# Patient Record
Sex: Male | Born: 1950 | Race: White | Hispanic: No | Marital: Married | Smoking: Never smoker
Health system: Southern US, Community
[De-identification: ages and names within clinical notes are randomized; demographics above are authoritative.]

## PROBLEM LIST (undated history)

## (undated) DIAGNOSIS — M546 Pain in thoracic spine: Secondary | ICD-10-CM

## (undated) DIAGNOSIS — I2699 Other pulmonary embolism without acute cor pulmonale: Secondary | ICD-10-CM

## (undated) DIAGNOSIS — N1330 Unspecified hydronephrosis: Secondary | ICD-10-CM

## (undated) DIAGNOSIS — R413 Other amnesia: Secondary | ICD-10-CM

## (undated) DIAGNOSIS — N281 Cyst of kidney, acquired: Secondary | ICD-10-CM

## (undated) DIAGNOSIS — N400 Enlarged prostate without lower urinary tract symptoms: Secondary | ICD-10-CM

## (undated) DIAGNOSIS — B351 Tinea unguium: Secondary | ICD-10-CM

## (undated) DIAGNOSIS — K219 Gastro-esophageal reflux disease without esophagitis: Secondary | ICD-10-CM

## (undated) DIAGNOSIS — N200 Calculus of kidney: Secondary | ICD-10-CM

## (undated) DIAGNOSIS — R197 Diarrhea, unspecified: Secondary | ICD-10-CM

## (undated) DIAGNOSIS — R5383 Other fatigue: Secondary | ICD-10-CM

## (undated) DIAGNOSIS — E785 Hyperlipidemia, unspecified: Secondary | ICD-10-CM

## (undated) DIAGNOSIS — J189 Pneumonia, unspecified organism: Secondary | ICD-10-CM

## (undated) DIAGNOSIS — R109 Unspecified abdominal pain: Secondary | ICD-10-CM

## (undated) DIAGNOSIS — M722 Plantar fascial fibromatosis: Secondary | ICD-10-CM

## (undated) DIAGNOSIS — N179 Acute kidney failure, unspecified: Secondary | ICD-10-CM

## (undated) DIAGNOSIS — C439 Malignant melanoma of skin, unspecified: Secondary | ICD-10-CM

## (undated) DIAGNOSIS — K279 Peptic ulcer, site unspecified, unspecified as acute or chronic, without hemorrhage or perforation: Secondary | ICD-10-CM

## (undated) HISTORY — DX: Diarrhea, unspecified: R19.7

## (undated) HISTORY — DX: Calculus of kidney: N20.0

## (undated) HISTORY — DX: Pneumonia, unspecified organism: J18.9

## (undated) HISTORY — DX: Plantar fascial fibromatosis: M72.2

## (undated) HISTORY — DX: Cyst of kidney, acquired: N28.1

## (undated) HISTORY — DX: Malignant melanoma of skin, unspecified: C43.9

## (undated) HISTORY — PX: TONSILLECTOMY: SHX5217

## (undated) HISTORY — DX: Pain in thoracic spine: M54.6

## (undated) HISTORY — DX: Other pulmonary embolism without acute cor pulmonale: I26.99

## (undated) HISTORY — DX: Peptic ulcer, site unspecified, unspecified as acute or chronic, without hemorrhage or perforation: K27.9

## (undated) HISTORY — DX: Unspecified abdominal pain: R10.9

## (undated) HISTORY — DX: Unspecified hydronephrosis: N13.30

## (undated) HISTORY — DX: Benign prostatic hyperplasia without lower urinary tract symptoms: N40.0

## (undated) HISTORY — DX: Other amnesia: R41.3

## (undated) HISTORY — DX: Other fatigue: R53.83

## (undated) HISTORY — PX: OTHER SURGICAL HISTORY: SHX169

## (undated) HISTORY — DX: Tinea unguium: B35.1

## (undated) HISTORY — DX: Hyperlipidemia, unspecified: E78.5

## (undated) HISTORY — DX: Gastro-esophageal reflux disease without esophagitis: K21.9

## (undated) HISTORY — DX: Acute kidney failure, unspecified: N17.9

---

## 2005-03-15 ENCOUNTER — Emergency Department: Payer: Self-pay | Admitting: General Practice

## 2005-03-16 ENCOUNTER — Other Ambulatory Visit: Payer: Self-pay

## 2005-03-16 ENCOUNTER — Emergency Department: Payer: Self-pay | Admitting: Emergency Medicine

## 2005-09-24 ENCOUNTER — Inpatient Hospital Stay: Payer: Self-pay | Admitting: Internal Medicine

## 2005-10-09 ENCOUNTER — Ambulatory Visit: Payer: Self-pay | Admitting: Internal Medicine

## 2005-10-23 ENCOUNTER — Ambulatory Visit: Payer: Self-pay | Admitting: Internal Medicine

## 2006-09-18 IMAGING — PT NM PET TUM IMG LTD AREA
1 series · 25 of 25 positions shown · non-contrast
Comparison: none

REASON FOR EXAM: ABN CT showed masses, eval for lung CA
COMMENTS:

[Series 4: pet wb · axial · 2.0mm · 4.06mm/px · z∈[-872,-14]mm · 25 of 344 slices shown]
[im 1/344]
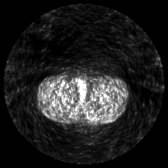
[im 15/344]
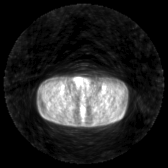
[im 29/344]
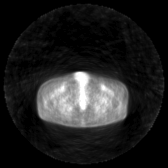
[im 43/344]
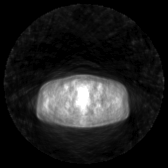
[im 58/344]
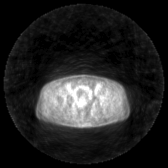
[im 72/344]
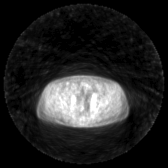
[im 86/344]
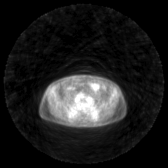
[im 101/344]
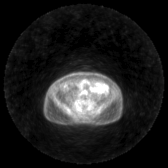
[im 115/344]
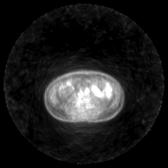
[im 129/344]
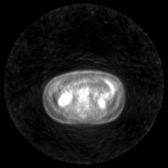
[im 143/344]
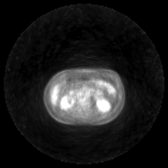
[im 158/344]
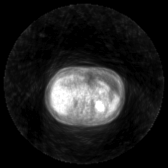
[im 172/344]
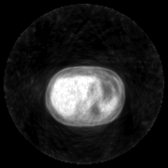
[im 186/344]
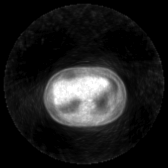
[im 201/344]
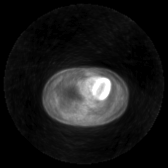
[im 215/344]
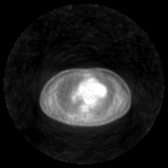
[im 229/344]
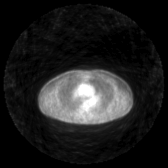
[im 243/344]
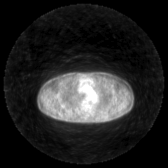
[im 258/344]
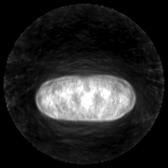
[im 272/344]
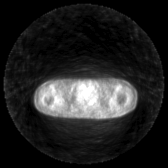
[im 286/344]
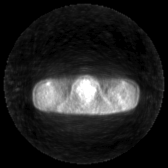
[im 301/344]
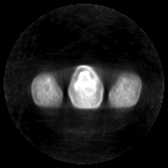
[im 315/344]
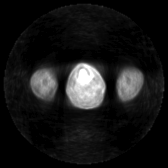
[im 329/344]
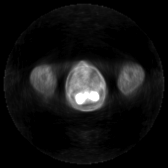
[im 344/344]
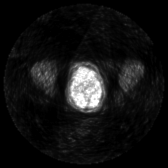

[25 of 25 positions shown; findings below may reference images not displayed]

PROCEDURE:     PET - PET/CT DX LUNG CA  - October 23, 2005 [DATE]

RESULT:          The patient had a questionable abnormality on a recent CT
scan. The patient's fasting blood glucose level today was 95 mg/dl.  The
patient received 15.5 mCi of fluorine-25 labeled fluorodeoxyglucose at [DATE]
hours, with the scanning beginning at [DATE] hours.

Uptake of the radiopharmaceutical over the thorax is normal.  Specific
attention to the areas of abnormal appearance on the CT images reveals no
abnormal uptake here.  No mediastinal or hilar lymphadenopathy is suspected.
 Activity over the neck is within the limits of normal as well.

Within the abdomen and pelvis, normally expected uptake within bowel and
kidneys and urinary bladder is seen.  I see no abnormal activity elsewhere.
IMPRESSION: I do not see findings on this study to suggest
malignancy.

## 2012-09-30 ENCOUNTER — Ambulatory Visit: Payer: Self-pay | Admitting: Unknown Physician Specialty

## 2012-12-17 ENCOUNTER — Ambulatory Visit: Payer: Self-pay | Admitting: Unknown Physician Specialty

## 2012-12-17 LAB — CREATININE, SERUM
Creatinine: 1.26 mg/dL (ref 0.60–1.30)
EGFR (African American): >60
EGFR (Non-African Amer.): >60

## 2013-05-31 ENCOUNTER — Ambulatory Visit: Payer: Self-pay | Admitting: Internal Medicine

## 2014-10-06 ENCOUNTER — Emergency Department
Admit: 2014-10-06 | Disposition: A | Discharge status: Home / Self Care | Payer: Self-pay | Admitting: Physician Assistant

## 2014-10-06 LAB — URINALYSIS, COMPLETE
Bacteria: NONE SEEN
Bilirubin,UR: NEGATIVE
Glucose,UR: NEGATIVE mg/dL (ref 0–75)
Ketone: NEGATIVE
LEUKOCYTE ESTERASE: NEGATIVE
NITRITE: NEGATIVE
Ph: 5 (ref 4.5–8.0)
Protein: 30
RBC,UR: 109 /HPF (ref 0–5)
Specific Gravity: 1.02 (ref 1.003–1.030)
Squamous Epithelial: <1
WBC UR: 2 /HPF (ref 0–5)

## 2014-10-06 LAB — COMPREHENSIVE METABOLIC PANEL
ALBUMIN: 4.5 g/dL
Alkaline Phosphatase: 69 U/L
Anion Gap: 6 — ABNORMAL LOW (ref 7–16)
BILIRUBIN TOTAL: 0.8 mg/dL
BUN: 22 mg/dL — ABNORMAL HIGH
Calcium, Total: 9.5 mg/dL
Chloride: 108 mmol/L
Co2: 25 mmol/L
Creatinine: 1.42 mg/dL — ABNORMAL HIGH
EGFR (Non-African Amer.): 51 — ABNORMAL LOW
GFR CALC AF AMER: 60 — AB
Glucose: 157 mg/dL — ABNORMAL HIGH
POTASSIUM: 4.5 mmol/L
SGOT(AST): 31 U/L
SGPT (ALT): 24 U/L
Sodium: 139 mmol/L
TOTAL PROTEIN: 7.5 g/dL

## 2014-10-06 LAB — CBC
HCT: 42.3 % (ref 40.0–52.0)
HGB: 14.2 g/dL (ref 13.0–18.0)
MCH: 30.3 pg (ref 26.0–34.0)
MCHC: 33.6 g/dL (ref 32.0–36.0)
MCV: 90 fL (ref 80–100)
Platelet: 218 10*3/uL (ref 150–440)
RBC: 4.69 10*6/uL (ref 4.40–5.90)
RDW: 13.5 % (ref 11.5–14.5)
WBC: 12.4 10*3/uL — ABNORMAL HIGH (ref 3.8–10.6)

## 2014-10-25 ENCOUNTER — Ambulatory Visit
Admit: 2014-10-25 | Disposition: A | Discharge status: Home / Self Care | Payer: Self-pay | Attending: Urology | Admitting: Urology

## 2014-10-25 LAB — BASIC METABOLIC PANEL
Anion Gap: 10 (ref 7–16)
BUN: 26 mg/dL — ABNORMAL HIGH
CHLORIDE: 102 mmol/L
Calcium, Total: 9.2 mg/dL
Co2: 25 mmol/L
Creatinine: 2.16 mg/dL — ABNORMAL HIGH
EGFR (African American): 36 — ABNORMAL LOW
GFR CALC NON AF AMER: 31 — AB
GLUCOSE: 105 mg/dL — AB
Potassium: 4.2 mmol/L
Sodium: 137 mmol/L

## 2014-10-25 LAB — CBC
HCT: 40.4 % (ref 40.0–52.0)
HGB: 13.6 g/dL (ref 13.0–18.0)
MCH: 30.2 pg (ref 26.0–34.0)
MCHC: 33.5 g/dL (ref 32.0–36.0)
MCV: 90 fL (ref 80–100)
Platelet: 199 10*3/uL (ref 150–440)
RBC: 4.5 10*6/uL (ref 4.40–5.90)
RDW: 13 % (ref 11.5–14.5)
WBC: 13.3 10*3/uL — ABNORMAL HIGH (ref 3.8–10.6)

## 2014-10-27 LAB — URINE CULTURE

## 2014-10-31 ENCOUNTER — Ambulatory Visit
Admit: 2014-10-31 | Disposition: A | Discharge status: Home / Self Care | Payer: Self-pay | Attending: Urology | Admitting: Urology

## 2014-11-04 NOTE — Op Note (Signed)
PATIENT NAME:  Carl Williams, Carl Williams MR#:  [id number] DATE OF BIRTH:  December 25, 1950  DATE OF PROCEDURE:  10/25/2014  PREOPERATIVE DIAGNOSIS:  Right flank pain, history of bilateral stones.   POSTOPERATIVE DIAGNOSIS:  Right flank pain, history of bilateral stones, right hydroureteronephrosis, pyonephrosis.   ANESTHESIA: General anesthesia.    ATTENDING SURGEON: Sherlynn Stalls, MD.   FINDINGS: Right hydroureteronephrosis down to the level of the distal ureter with possible filling defect consistent with stone, purulent, debris-filled drainage following stent placement.  SPECIMEN: Urine culture.   DRAINS: A 6 x 26 French double-J ureteral stent placement on the right (no string).   COMPLICATIONS: None.   INDICATION: This is a 64 year old male with a history of bilateral kidney stones who has developed worsening right flank pain over the past week. On recent CT scan he did have a left distal ureter stone, but felt that he would pass this without further flank pain. Given the severity of pain he was offered ureteral stent placement. Incidentally in the preoperative holding area he was found to be mildly tachycardic and had a low-grade temperature to 100.8. Risks and benefits of the procedure were explained in detail. The patient agreed to proceed as planned.   PROCEDURE: The patient was correctly identified in the preoperative holding area and informed consent was confirmed. He was brought to the operating suite and placed on the table in supine position. At this time universal timeout protocol was performed. All team members were identified. Venodyne boots were placed and he was administered IV Ancef in the perioperative period. He was then placed under general anesthesia, repositioned lower on the bed in the dorsal lithotomy position, and prepped and draped in standard surgical fashion. At this point in time a 22 French rigid cystoscope was advanced per urethra into the bladder. Attention was turned  then to the left ureteral orifice, which was cannulated using a 5 [place] open-ended ureteral catheter just within the UO. Retrograde pyelogram was performed on this side, which revealed a delicate-appearing ureter without any filling defects or evidence of left hydroureteronephrosis. The calyces and infundibulum were delicate without any obvious filling defects. Attention was then turned to the right ureteral orifice, which was cannulated with a 5 [place] open-ended ureteral catheter in a similar manner. Quite differently on this side, there was mild hydroureteronephrosis extending all the way down to the level of the distal ureter where there was a clear transition point and possible filling defect here. The very distal ureter was completely decompressed. Given the concern for stone on this side I did elect to go ahead and place a stent, in the setting of also possible infection.  A Sensor wire was placed all the way up to the level of the kidney without difficulty. At this point in time a 6 x 26 French double-J ureteral stent was then advanced over the wire to the level of the renal pelvis, confirmed fluoroscopically. The wire was partially withdrawn and a coil was noted within the renal pelvis. The wire was then fully withdrawn and a coil was noted within the bladder. Upon stent placement there was a good amount of purulent and debris-filled, almost feculent-appearing drainage from the right kidney ureteral stent. Urine from the bladder after stent placement was sent off for urine culture. The bladder was then drained. The patient was repositioned in supine position, reversed from anesthesia, and taken to the PACU in stable condition. There were no complications in this case.    ____________________________ Sherlynn Stalls, MD  ajb:bu D: 10/25/2014 11:17:45 ET T: 10/25/2014 14:23:03 ET JOB#: [id number]  cc: Sherlynn Stalls, MD, <Dictator> Sherlynn Stalls MD ELECTRONICALLY SIGNED 11/01/2014 16:24

## 2014-11-22 ENCOUNTER — Other Ambulatory Visit: Payer: Self-pay | Admitting: Urology

## 2014-11-22 DIAGNOSIS — N200 Calculus of kidney: Secondary | ICD-10-CM

## 2014-12-12 ENCOUNTER — Ambulatory Visit
Admission: RE | Admit: 2014-12-12 | Discharge: 2014-12-12 | Disposition: A | Discharge status: Home / Self Care | Payer: BC Managed Care – PPO | Source: Ambulatory Visit | Attending: Urology | Admitting: Urology

## 2014-12-12 DIAGNOSIS — N1330 Unspecified hydronephrosis: Secondary | ICD-10-CM | POA: Diagnosis not present

## 2014-12-12 DIAGNOSIS — N200 Calculus of kidney: Secondary | ICD-10-CM | POA: Diagnosis present

## 2014-12-17 ENCOUNTER — Ambulatory Visit: Payer: Self-pay | Admitting: Urology

## 2014-12-27 ENCOUNTER — Ambulatory Visit (INDEPENDENT_AMBULATORY_CARE_PROVIDER_SITE_OTHER): Payer: BC Managed Care – PPO | Admitting: Urology

## 2014-12-27 ENCOUNTER — Encounter: Payer: Self-pay | Admitting: Urology

## 2014-12-27 VITALS — BP 118/71 | HR 60 | Ht 68.0 in | Wt 170.9 lb

## 2014-12-27 DIAGNOSIS — N132 Hydronephrosis with renal and ureteral calculous obstruction: Secondary | ICD-10-CM | POA: Diagnosis not present

## 2014-12-27 DIAGNOSIS — N401 Enlarged prostate with lower urinary tract symptoms: Secondary | ICD-10-CM | POA: Diagnosis not present

## 2014-12-27 DIAGNOSIS — N138 Other obstructive and reflux uropathy: Secondary | ICD-10-CM

## 2014-12-27 DIAGNOSIS — N200 Calculus of kidney: Secondary | ICD-10-CM | POA: Diagnosis not present

## 2014-12-27 LAB — URINALYSIS, COMPLETE
Bilirubin, UA: NEGATIVE
Glucose, UA: NEGATIVE
KETONES UA: NEGATIVE
LEUKOCYTES UA: NEGATIVE
Nitrite, UA: NEGATIVE
Protein, UA: NEGATIVE
RBC, UA: NEGATIVE
Specific Gravity, UA: 1.02 (ref 1.005–1.030)
Urobilinogen, Ur: 0.2 mg/dL (ref 0.2–1.0)
pH, UA: 6.5 (ref 5.0–7.5)

## 2014-12-27 LAB — MICROSCOPIC EXAMINATION: BACTERIA UA: NONE SEEN

## 2014-12-27 NOTE — Progress Notes (Signed)
12/27/2014 8:37 AM   Carl Williams 1951-03-25 [id number]  Referring provider: No referring provider defined for this encounter.  Chief Complaint  Patient presents with  . Follow-up    Diagnostice procedure, Renal US results    HPI: Carl Williams is a 64 year old white male who presents today for his RUS results.  He is feeling well today.  He specifically denies fevers, chills, nausea, vomiting, hematuria or flank pain.  His RUS performed on 12/12/2014 demonstrated mild right sided hydronephrosis.    Background Hx:  64 year old male with history of bilateral nephrolithiasis who initially presented found to have a LEFT 5 x 3 mm UVJ stone on CT scan who presented to our office for follow-up on 10/24/14. At that time, his left-sided flank pain had completely resolved but he developed acute onset severe right-sided flank pain.   Given the severity of the pain, he was taken to the operating room following morning for bilateral retrograde pyelogram as well as RIGHT ureteral stent placement. Retrograde pyelogram at that time revealed a decompressed LEFT ureter. The RIGHT ureter had a filling defect within the distal ureter which was small and was hydronephrotic all the redundant that level. At the time of stent placement, significant purulent drainage was noted and incidentally he was noted to be mildly febrile in the preoperative holding area prior to this procedure. He was discharged home on antibiotics. His intraoperative urine culture was ultimately negative. He was noted to have acute renal failure with a creatinine of 2.16 at the time of stent placement.  At follow up visit, a KUB showed no evidence of residual stones in the RIGHT distal ureter and his stent was removed.     PMH: Past Medical History  Diagnosis Date  . Memory difficulties   . Onychomycosis   . GERD (gastroesophageal reflux disease)   . Back pain, thoracic   . Diarrhea   . HLD (hyperlipidemia)   . PE  (pulmonary embolism)   . Peptic ulcer   . Melanoma   . Fatigue   . Plantar fascial fibromatosis   . Pneumonia   . Hydronephrosis   . Kidney stone on left side   . Acute renal failure   . Right flank pain   . BPH (benign prostatic hyperplasia)   . Renal cyst     Surgical History: Past Surgical History  Procedure Laterality Date  . Tonsillectomy      Home Medications:    Medication List       This list is accurate as of: 12/27/14  8:37 AM.  Always use your most recent med list.               tamsulosin 0.4 MG Caps capsule  Commonly known as:  FLOMAX  Take 0.4 mg by mouth.        Allergies:  Allergies  Allergen Reactions  . Ciprofloxacin Rash    Family History: No family history on file.  Social History:  reports that he has never smoked. He does not have any smokeless tobacco history on file. He reports that he does not drink alcohol. His drug history is not on file.  ROS: Urological Symptom Review  Patient is experiencing the following symptoms: Hard to postpone urination   Review of Systems  Gastrointestinal (upper)  : Negative for upper GI symptoms  Gastrointestinal (lower) : Negative for lower GI symptoms  Constitutional : Negative for symptoms  Skin: Negative for skin symptoms  Eyes: Negative for eye symptoms  Ear/Nose/Throat : Negative for Ear/Nose/Throat symptoms  Hematologic/Lymphatic: Negative for Hematologic/Lymphatic symptoms  Cardiovascular : Negative for cardiovascular symptoms  Respiratory : Negative for respiratory symptoms  Endocrine: Negative for endocrine symptoms  Musculoskeletal: Negative for musculoskeletal symptoms  Neurological: Negative for neurological symptoms  Psychologic: Negative for psychiatric symptoms   Physical Exam: BP 118/71 mmHg  Pulse 60  Ht 5\' 8"  (1.727 m)  Wt 170 lb 14.4 oz (77.52 kg)  BMI 25.99 kg/m2   Laboratory Data: Results for orders placed or performed in visit on  12/27/14  Microscopic Examination  Result Value Ref Range   WBC, UA 0-5 0 -  5 /hpf   RBC, UA 0-2 0 -  2 /hpf   Epithelial Cells (non renal) 0-10 0 - 10 /hpf   Bacteria, UA None seen None seen/Few  Urinalysis, Complete  Result Value Ref Range   Specific Gravity, UA 1.020 1.005 - 1.030   pH, UA 6.5 5.0 - 7.5   Color, UA Yellow Yellow   Appearance Ur Clear Clear   Leukocytes, UA Negative Negative   Protein, UA Negative Negative/Trace   Glucose, UA Negative Negative   Ketones, UA Negative Negative   RBC, UA Negative Negative   Bilirubin, UA Negative Negative   Urobilinogen, Ur 0.2 0.2 - 1.0 mg/dL   Nitrite, UA Negative Negative   Microscopic Examination See below:    Lab Results  Component Value Date   WBC 13.3* 10/25/2014   HGB 13.6 10/25/2014   HCT 40.4 10/25/2014   MCV 90 10/25/2014   PLT 199 10/25/2014    Lab Results  Component Value Date   CREATININE 2.16* 10/25/2014    No results found for: PSA  No results found for: TESTOSTERONE  No results found for: HGBA1C  Urinalysis No results found for: COLORURINE, APPEARANCEUR, LABSPEC, PHURINE, GLUCOSEU, HGBUR, BILIRUBINUR, KETONESUR, PROTEINUR, UROBILINOGEN, NITRITE, LEUKOCYTESUR  Pertinent Imaging: CLINICAL DATA: Calculus of the kidney.  EXAM: RENAL / URINARY TRACT ULTRASOUND COMPLETE  COMPARISON: One-view abdomen 10/31/2014. CT of the abdomen pelvis 10/06/2014.  FINDINGS: Right Kidney:  Length: 12.0 cm, within normal limits. Echogenicity within normal limits. Mild hydronephrosis is present. No echogenic stones are identified. No obstructing lesion is evident.  Left Kidney:  Length: 11.2 cm, within normal limits. Echogenicity within normal limits. No mass or echogenic stone is evident. There is no significant hydronephrosis.  Bladder:  Appears normal for degree of bladder distention.  IMPRESSION: 1. Mild right-sided hydronephrosis without identification of the obstructing lesion. 2. No  echogenic stones are identified.   Electronically Signed  By: San Morelle M.D.  On: 12/12/2014 14:34   Assessment & Plan:    1. Mild right-sided hydronephrosis:  Background Hx:  64 year old male with history of bilateral nephrolithiasis who initially presented found to have a LEFT 5 x 3 mm UVJ stone on CT scan who presented to our office for follow-up on 10/24/14. At that time, his left-sided flank pain had completely resolved but he developed acute onset severe right-sided flank pain.   Given the severity of the pain, he was taken to the operating room following morning for bilateral retrograde pyelogram as well as RIGHT ureteral stent placement. Retrograde pyelogram at that time revealed a decompressed LEFT ureter. The RIGHT ureter had a filling defect within the distal ureter which was small and was hydronephrotic all the redundant that level. At the time of stent placement, significant purulent drainage was noted and incidentally he was noted to be mildly febrile in the preoperative holding area prior to  this procedure. He was discharged home on antibiotics. His intraoperative urine culture was ultimately negative. He was noted to have acute renal failure with a creatinine of 2.16 at the time of stent placement.  At follow up visit, a KUB showed no evidence of residual stones in the RIGHT distal ureter and his stent was removed.    RUS completed on 12/12/2014 noted a mild right-sided hydronephrosis.  Patient is not experiencing flank pain, nausea or vomiting at this time.  This may be a chronic finding.  We will obtain a renal panel and repeat the RUS in one month.    2. Nephrolithiasis:   Patient now has a history of stones.  We will obtain a 24 urine and he will return to the office to discuss the results.    3. BPH with LUTS:   Patient was found to have an enlarged prostate on exam at his initial encounter.  We will draw a PSA today.  He will complete an IPSS  score sheet when he returns to discuss his 24 hour urine results.   There are no diagnoses linked to this encounter.  No Follow-up on file.  Zara Council, [facility] [address] [place], [place] [address] [phone]

## 2014-12-28 ENCOUNTER — Telehealth: Payer: Self-pay

## 2014-12-28 DIAGNOSIS — N200 Calculus of kidney: Secondary | ICD-10-CM

## 2014-12-28 LAB — RENAL FUNCTION PANEL
ALBUMIN: 4 g/dL (ref 3.6–4.8)
BUN / CREAT RATIO: 12 (ref 10–22)
BUN: 14 mg/dL (ref 8–27)
CALCIUM: 9.7 mg/dL (ref 8.6–10.2)
CO2: 24 mmol/L (ref 18–29)
Chloride: 99 mmol/L (ref 97–108)
Creatinine, Ser: 1.15 mg/dL (ref 0.76–1.27)
GFR calc Af Amer: 77 mL/min/{1.73_m2} (ref >59)
GFR calc non Af Amer: 66 mL/min/{1.73_m2} (ref >59)
Glucose: 91 mg/dL (ref 65–99)
Phosphorus: 2.2 mg/dL — ABNORMAL LOW (ref 2.5–4.5)
Potassium: 4.1 mmol/L (ref 3.5–5.2)
Sodium: 138 mmol/L (ref 134–144)

## 2014-12-28 LAB — PSA: Prostate Specific Ag, Serum: 2.8 ng/mL (ref 0.0–4.0)

## 2014-12-28 NOTE — Telephone Encounter (Signed)
-----   Message from Nori Riis, PA-C sent at 12/28/2014  8:33 AM EDT ----- Labs are good.  We will see him in one month for his 24 hour report and a repeat RUS to see if the mild swelling in the kidney resolves.

## 2014-12-28 NOTE — Telephone Encounter (Signed)
Spoke with pt in reference to labs. Made pt aware he will need to complete 24hr urine and RUS prior to appt. Pt voiced understanding. RUS order has been placed. Cw,lpn

## 2015-01-08 ENCOUNTER — Other Ambulatory Visit: Payer: BC Managed Care – PPO

## 2015-01-24 ENCOUNTER — Telehealth: Payer: Self-pay

## 2015-01-24 ENCOUNTER — Other Ambulatory Visit: Payer: Self-pay | Admitting: Urology

## 2015-01-24 NOTE — Telephone Encounter (Signed)
LMOM

## 2015-01-24 NOTE — Telephone Encounter (Signed)
Are you looking for PSA and/or litholink?

## 2015-01-24 NOTE — Telephone Encounter (Signed)
-----   Message from Nori Riis, PA-C sent at 01/24/2015 11:08 AM EDT ----- Patient will need an office visit to discuss his Litho link results.

## 2015-01-24 NOTE — Telephone Encounter (Signed)
-----   Message from Nori Riis, PA-C sent at 01/24/2015 10:45 AM EDT ----- I received a message stating lab reports have been scanned, but I cannot find lab reports in the patient's chart.

## 2015-01-25 NOTE — Telephone Encounter (Signed)
-----   Message from Nori Riis, PA-C sent at 01/24/2015 11:08 AM EDT ----- Patient will need an office visit to discuss his Litho link results.

## 2015-01-25 NOTE — Telephone Encounter (Signed)
Spoke with pt in reference for the need of an appt for results. Pt voiced understanding. [place] office will call pt back with f/u appt.

## 2015-01-28 ENCOUNTER — Ambulatory Visit: Payer: BC Managed Care – PPO

## 2015-01-29 ENCOUNTER — Ambulatory Visit: Payer: BC Managed Care – PPO | Admitting: Urology

## 2015-02-06 ENCOUNTER — Other Ambulatory Visit: Payer: Self-pay | Admitting: Internal Medicine

## 2015-02-06 DIAGNOSIS — R413 Other amnesia: Secondary | ICD-10-CM

## 2015-02-06 DIAGNOSIS — R42 Dizziness and giddiness: Secondary | ICD-10-CM

## 2015-02-12 ENCOUNTER — Ambulatory Visit: Payer: BC Managed Care – PPO

## 2015-02-22 ENCOUNTER — Ambulatory Visit: Payer: BC Managed Care – PPO

## 2015-02-27 ENCOUNTER — Telehealth: Payer: Self-pay | Admitting: Urology

## 2015-02-27 NOTE — Telephone Encounter (Signed)
Patient was

## 2015-02-27 NOTE — Telephone Encounter (Signed)
Patient was to have a follow up RUS because he still had some residual hydronephrosis on his last RUS.  Would you call the patient to reschedule his RUS?

## 2015-03-07 NOTE — Telephone Encounter (Signed)
Spoke with patient and he said he forgot about the [place] appt and then we got disconnected. I tried calling him back but could never get him so i left a message and told him to let me know if he wanted to reschd and i would take care of it for him.  Thanks, Sharyn Lull

## 2015-03-19 NOTE — Telephone Encounter (Signed)
We will need to send a certified letter to him about scheduling his RUS.

## 2015-03-22 NOTE — Telephone Encounter (Signed)
Letter sent.

## 2015-06-17 ENCOUNTER — Telehealth: Payer: Self-pay | Admitting: Urology

## 2015-06-17 ENCOUNTER — Other Ambulatory Visit: Payer: Self-pay | Admitting: Urology

## 2015-06-17 DIAGNOSIS — N132 Hydronephrosis with renal and ureteral calculous obstruction: Secondary | ICD-10-CM

## 2015-06-17 NOTE — Telephone Encounter (Signed)
I called [facility] to order the [place] that the patient no showed for back in July. They said that you would need to put in a new order since it was 42 months old. Once i get the new order i will release it and they will call him and get it set up. The patient is aware.  Thanks,  Sharyn Lull

## 2015-06-21 ENCOUNTER — Ambulatory Visit
Admission: RE | Admit: 2015-06-21 | Discharge: 2015-06-21 | Disposition: A | Discharge status: Home / Self Care | Payer: BC Managed Care – PPO | Source: Ambulatory Visit | Attending: Urology | Admitting: Urology

## 2015-06-21 DIAGNOSIS — N1330 Unspecified hydronephrosis: Secondary | ICD-10-CM | POA: Diagnosis not present

## 2015-06-21 DIAGNOSIS — N400 Enlarged prostate without lower urinary tract symptoms: Secondary | ICD-10-CM | POA: Insufficient documentation

## 2015-06-21 DIAGNOSIS — N132 Hydronephrosis with renal and ureteral calculous obstruction: Secondary | ICD-10-CM

## 2015-06-28 ENCOUNTER — Encounter: Payer: Self-pay | Admitting: Urology

## 2015-06-28 ENCOUNTER — Ambulatory Visit (INDEPENDENT_AMBULATORY_CARE_PROVIDER_SITE_OTHER): Payer: BC Managed Care – PPO | Admitting: Urology

## 2015-06-28 VITALS — BP 128/85 | HR 60 | Ht 68.0 in | Wt 171.8 lb

## 2015-06-28 DIAGNOSIS — N138 Other obstructive and reflux uropathy: Secondary | ICD-10-CM

## 2015-06-28 DIAGNOSIS — N401 Enlarged prostate with lower urinary tract symptoms: Secondary | ICD-10-CM | POA: Diagnosis not present

## 2015-06-28 DIAGNOSIS — N2889 Other specified disorders of kidney and ureter: Secondary | ICD-10-CM

## 2015-06-28 DIAGNOSIS — N200 Calculus of kidney: Secondary | ICD-10-CM | POA: Diagnosis not present

## 2015-06-28 LAB — URINALYSIS, COMPLETE
BILIRUBIN UA: NEGATIVE
Glucose, UA: NEGATIVE
KETONES UA: NEGATIVE
LEUKOCYTES UA: NEGATIVE
Nitrite, UA: NEGATIVE
Protein, UA: NEGATIVE
Specific Gravity, UA: 1.01 (ref 1.005–1.030)
Urobilinogen, Ur: 0.2 mg/dL (ref 0.2–1.0)
pH, UA: 6.5 (ref 5.0–7.5)

## 2015-06-28 LAB — MICROSCOPIC EXAMINATION
Bacteria, UA: NONE SEEN
Epithelial Cells (non renal): NONE SEEN /hpf (ref 0–10)

## 2015-06-28 NOTE — Progress Notes (Signed)
10:04 PM   TARIF MCMANAWAY 1950/10/12 [id number]  Referring provider: Idelle Crouch, MD [facility] [place], [place] [address]  Chief Complaint  Patient presents with  . Results    [place]    HPI: [name] is a 64 year old white male who presents today for his RUS results.  He is feeling well today.  He specifically denies fevers, chills, nausea, vomiting, hematuria or flank pain.  His RUS performed on 06/21/2015 demonstrated mild right pelvicaliectasis.  His UA today is unremarkable.  Background Hx:  64 year old male with history of bilateral nephrolithiasis who initially presented found to have a LEFT 5 x 3 mm UVJ stone on CT scan who presented to our office for follow-up on 10/24/14. At that time, his left-sided flank pain had completely resolved but he developed acute onset severe right-sided flank pain.   Given the severity of the pain, he was taken to the operating room following morning for bilateral retrograde pyelogram as well as RIGHT ureteral stent placement. Retrograde pyelogram at that time revealed a decompressed LEFT ureter. The RIGHT ureter had a filling defect within the distal ureter which was small and was hydronephrotic all the redundant that level. At the time of stent placement, significant purulent drainage was noted and incidentally he was noted to be mildly febrile in the preoperative holding area prior to this procedure. He was discharged home on antibiotics. His intraoperative urine culture was ultimately negative. He was noted to have acute renal failure with a creatinine of 2.16 at the time of stent placement.  At follow up visit, a KUB showed no evidence of residual stones in the RIGHT distal ureter and his stent was removed.    He underwent a follow-up renal ultrasound on 12/12/2014 and it noted mild right-sided hydronephrosis.  He was to undergo a follow-up renal ultrasound in 1 month, but he was lost to  follow-up.  I have reviewed the images with the patient and Dr. Pilar Jarvis and agree with the findings.     PMH: Past Medical History  Diagnosis Date  . Memory difficulties   . Onychomycosis   . GERD (gastroesophageal reflux disease)   . Back pain, thoracic   . Diarrhea   . HLD (hyperlipidemia)   . PE (pulmonary embolism)   . Peptic ulcer   . Melanoma ([place])   . Fatigue   . Plantar fascial fibromatosis   . Pneumonia   . Hydronephrosis   . Kidney stone on left side   . Acute renal failure ([place])   . Right flank pain   . BPH (benign prostatic hyperplasia)   . Renal cyst     Surgical History: Past Surgical History  Procedure Laterality Date  . Tonsillectomy      Home Medications:    Medication List       This list is accurate as of: 06/28/15 10:04 PM.  Always use your most recent med list.               amphetamine-dextroamphetamine 10 MG tablet  Commonly known as:  ADDERALL  Take by mouth.     tamsulosin 0.4 MG Caps capsule  Commonly known as:  FLOMAX  Take 0.4 mg by mouth. Reported on 06/28/2015        Allergies:  Allergies  Allergen Reactions  . Ciprofloxacin Rash    Family History: Family History  Problem Relation Age of Onset  . Kidney disease Neg Hx   . Prostate  cancer Neg Hx     Social History:  reports that he has never smoked. He does not have any smokeless tobacco history on file. He reports that he does not drink alcohol or use illicit drugs.  ROS: Urological Symptom Review  Patient is experiencing the following symptoms: Hard to postpone urination   Review of Systems  Gastrointestinal (upper)  : Negative for upper GI symptoms  Gastrointestinal (lower) : Negative for lower GI symptoms  Constitutional : Negative for symptoms  Skin: Negative for skin symptoms  Eyes: Negative for eye symptoms  Ear/Nose/Throat : Negative for Ear/Nose/Throat symptoms  Hematologic/Lymphatic: Negative for Hematologic/Lymphatic  symptoms  Cardiovascular : Negative for cardiovascular symptoms  Respiratory : Negative for respiratory symptoms  Endocrine: Negative for endocrine symptoms  Musculoskeletal: Negative for musculoskeletal symptoms  Neurological: Negative for neurological symptoms  Psychologic: Negative for psychiatric symptoms   Physical Exam: BP 128/85 mmHg  Pulse 60  Ht 5\' 8"  (1.727 m)  Wt 171 lb 12.8 oz (77.928 kg)  BMI 26.13 kg/m2  Constitutional: Well nourished. Alert and oriented, No acute distress. HEENT: [place] AT, moist mucus membranes. Trachea midline, no masses. Cardiovascular: No clubbing, cyanosis, or edema. Respiratory: Normal respiratory effort, no increased work of breathing. Skin: No rashes, bruises or suspicious lesions. Lymph: No cervical or inguinal adenopathy. Neurologic: Grossly intact, no focal deficits, moving all 4 extremities. Psychiatric: Normal mood and affect.  Laboratory Data: Results for orders placed or performed in visit on 06/28/15  Microscopic Examination  Result Value Ref Range   WBC, UA 0-5 0 -  5 /hpf   RBC, UA 0-2 0 -  2 /hpf   Epithelial Cells (non renal) None seen 0 - 10 /hpf   Bacteria, UA None seen None seen/Few  Urinalysis, Complete  Result Value Ref Range   Specific Gravity, UA 1.010 1.005 - 1.030   pH, UA 6.5 5.0 - 7.5   Color, UA Yellow Yellow   Appearance Ur Clear Clear   Leukocytes, UA Negative Negative   Protein, UA Negative Negative/Trace   Glucose, UA Negative Negative   Ketones, UA Negative Negative   RBC, UA 1+ (A) Negative   Bilirubin, UA Negative Negative   Urobilinogen, Ur 0.2 0.2 - 1.0 mg/dL   Nitrite, UA Negative Negative   Microscopic Examination See below:    Lab Results  Component Value Date   WBC 13.3* 10/25/2014   HGB 13.6 10/25/2014   HCT 40.4 10/25/2014   MCV 90 10/25/2014   PLT 199 10/25/2014    Lab Results  Component Value Date   CREATININE 1.15 12/27/2014    Lab Results  Component Value Date    PSA 2.8 12/27/2014   Urinalysis Results for orders placed or performed in visit on 06/28/15  Microscopic Examination  Result Value Ref Range   WBC, UA 0-5 0 -  5 /hpf   RBC, UA 0-2 0 -  2 /hpf   Epithelial Cells (non renal) None seen 0 - 10 /hpf   Bacteria, UA None seen None seen/Few  Urinalysis, Complete  Result Value Ref Range   Specific Gravity, UA 1.010 1.005 - 1.030   pH, UA 6.5 5.0 - 7.5   Color, UA Yellow Yellow   Appearance Ur Clear Clear   Leukocytes, UA Negative Negative   Protein, UA Negative Negative/Trace   Glucose, UA Negative Negative   Ketones, UA Negative Negative   RBC, UA 1+ (A) Negative   Bilirubin, UA Negative Negative   Urobilinogen, Ur 0.2 0.2 -  1.0 mg/dL   Nitrite, UA Negative Negative   Microscopic Examination See below:     Pertinent Imaging: CLINICAL DATA: Hydronephrosis. History kidney stones.  EXAM: RENAL / URINARY TRACT ULTRASOUND COMPLETE  COMPARISON: None.  FINDINGS: Right Kidney:  Length: 10.6 cm. 1.1 x 0.9 x 1.2 cm right upper pole renal mass with increased through transmission most consistent with a cyst. Echogenicity within normal limits. Mild right pyelocaliectasis.  Left Kidney:  Length: 11.8 cm. Echogenicity within normal limits. No mass or hydronephrosis visualized.  Bladder:  Appears normal for degree of bladder distention. Enlarged prostate gland measuring 4.7 x 4.5 x 4.7 cm.  IMPRESSION: 1. Mild right pelvicaliectasis. 2. No left hydronephrosis. 3. Prostatomegaly.   Electronically Signed  By: Kathreen Devoid  On: 06/21/2015 10:54  Assessment & Plan:    1. Mild right-sided pelvicaliectasis:   Patient is not experiencing flank pain, nausea or vomiting at this time.  His UA is unremarkable today.  This may be a chronic finding.  We will  repeat the RUS in 6 months.    2. Nephrolithiasis:   Patient now has a history of stones.   Patient did undergo 24-hour urine testing.  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  He is given dietary instructions. We will recheck his 24-hour urine when he returns in 6 months.  3. BPH with LUTS:   Patient will complete and I PSS score sheet, undergo an exam and have a PSA obtained when he returns in 6 months.  Return in about 6 months (around 12/27/2015) for RUS report in 6 months.  Zara Council, [facility] [address], [place], [place] [address] [phone]

## 2015-06-30 LAB — CULTURE, URINE COMPREHENSIVE

## 2015-09-26 IMAGING — CR DG ABDOMEN 1V
1 series · 1 of 1 positions shown · non-contrast
Comparison: CT abdomen and pelvis 10/06/2014

CLINICAL DATA: Kidney stones. Stent placement 1 week ago.
Follow-up.

EXAM:
ABDOMEN - 1 VIEW

[kdxr kidney ureter bladder]
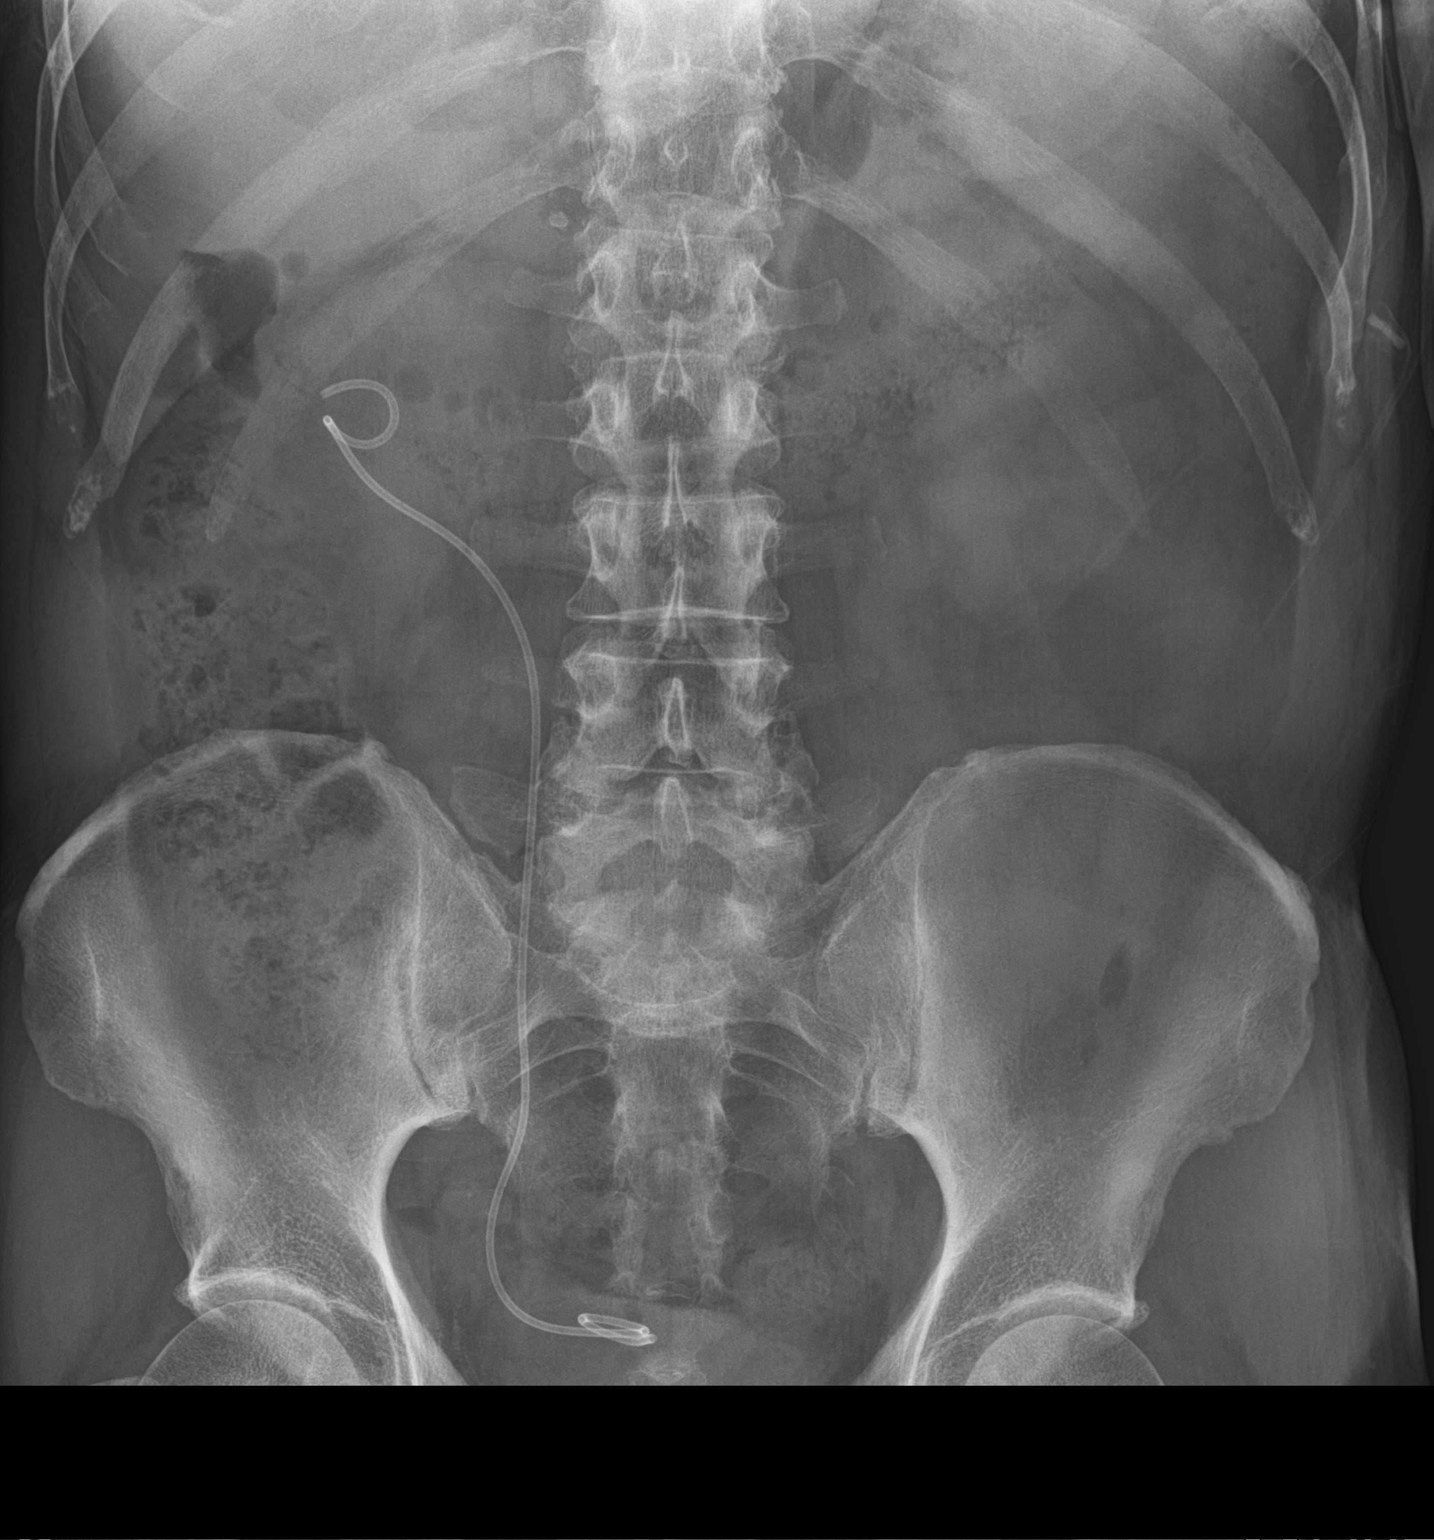

[1 of 1 positions shown; findings below may reference images not displayed]

FINDINGS: There has been interval placement of a a right-sided double-J
ureteral stent which terminates in the pelvis over the bladder. No
radiopaque urinary tract calculi are identified. No bowel dilatation
is seen to suggest obstruction. Small amount of stool is present in
the ascending and transverse colon. No acute osseous abnormality is
seen.
IMPRESSION: Right ureteral stent. No radiopaque urinary tract calculi
identified.

## 2015-12-04 DIAGNOSIS — M25512 Pain in left shoulder: Secondary | ICD-10-CM | POA: Diagnosis not present

## 2015-12-04 DIAGNOSIS — M7542 Impingement syndrome of left shoulder: Secondary | ICD-10-CM | POA: Diagnosis not present

## 2015-12-04 DIAGNOSIS — M25612 Stiffness of left shoulder, not elsewhere classified: Secondary | ICD-10-CM | POA: Diagnosis not present

## 2015-12-04 DIAGNOSIS — M6281 Muscle weakness (generalized): Secondary | ICD-10-CM | POA: Diagnosis not present

## 2015-12-10 DIAGNOSIS — M25612 Stiffness of left shoulder, not elsewhere classified: Secondary | ICD-10-CM | POA: Diagnosis not present

## 2015-12-10 DIAGNOSIS — M6281 Muscle weakness (generalized): Secondary | ICD-10-CM | POA: Diagnosis not present

## 2015-12-10 DIAGNOSIS — M7542 Impingement syndrome of left shoulder: Secondary | ICD-10-CM | POA: Diagnosis not present

## 2015-12-10 DIAGNOSIS — M25512 Pain in left shoulder: Secondary | ICD-10-CM | POA: Diagnosis not present

## 2015-12-12 DIAGNOSIS — M7542 Impingement syndrome of left shoulder: Secondary | ICD-10-CM | POA: Diagnosis not present

## 2015-12-12 DIAGNOSIS — M6281 Muscle weakness (generalized): Secondary | ICD-10-CM | POA: Diagnosis not present

## 2015-12-12 DIAGNOSIS — M25512 Pain in left shoulder: Secondary | ICD-10-CM | POA: Diagnosis not present

## 2015-12-12 DIAGNOSIS — M25612 Stiffness of left shoulder, not elsewhere classified: Secondary | ICD-10-CM | POA: Diagnosis not present

## 2015-12-17 DIAGNOSIS — M6281 Muscle weakness (generalized): Secondary | ICD-10-CM | POA: Diagnosis not present

## 2015-12-17 DIAGNOSIS — M25612 Stiffness of left shoulder, not elsewhere classified: Secondary | ICD-10-CM | POA: Diagnosis not present

## 2015-12-17 DIAGNOSIS — M25512 Pain in left shoulder: Secondary | ICD-10-CM | POA: Diagnosis not present

## 2015-12-17 DIAGNOSIS — M7542 Impingement syndrome of left shoulder: Secondary | ICD-10-CM | POA: Diagnosis not present

## 2015-12-19 DIAGNOSIS — M7542 Impingement syndrome of left shoulder: Secondary | ICD-10-CM | POA: Diagnosis not present

## 2015-12-19 DIAGNOSIS — M25512 Pain in left shoulder: Secondary | ICD-10-CM | POA: Diagnosis not present

## 2015-12-19 DIAGNOSIS — M6281 Muscle weakness (generalized): Secondary | ICD-10-CM | POA: Diagnosis not present

## 2015-12-19 DIAGNOSIS — M25612 Stiffness of left shoulder, not elsewhere classified: Secondary | ICD-10-CM | POA: Diagnosis not present

## 2015-12-20 ENCOUNTER — Ambulatory Visit
Admission: RE | Admit: 2015-12-20 | Discharge: 2015-12-20 | Disposition: A | Discharge status: Home / Self Care | Payer: Medicare Other | Source: Ambulatory Visit | Attending: Urology | Admitting: Urology

## 2015-12-20 DIAGNOSIS — N400 Enlarged prostate without lower urinary tract symptoms: Secondary | ICD-10-CM | POA: Insufficient documentation

## 2015-12-20 DIAGNOSIS — N200 Calculus of kidney: Secondary | ICD-10-CM | POA: Diagnosis not present

## 2015-12-20 DIAGNOSIS — N281 Cyst of kidney, acquired: Secondary | ICD-10-CM | POA: Insufficient documentation

## 2015-12-27 ENCOUNTER — Other Ambulatory Visit: Payer: Self-pay | Admitting: Urology

## 2015-12-27 ENCOUNTER — Encounter: Payer: Self-pay | Admitting: Urology

## 2015-12-27 ENCOUNTER — Ambulatory Visit (INDEPENDENT_AMBULATORY_CARE_PROVIDER_SITE_OTHER): Payer: Medicare Other | Admitting: Urology

## 2015-12-27 VITALS — BP 133/73 | HR 64 | Ht 68.0 in | Wt 173.7 lb

## 2015-12-27 DIAGNOSIS — N2889 Other specified disorders of kidney and ureter: Secondary | ICD-10-CM

## 2015-12-27 DIAGNOSIS — N138 Other obstructive and reflux uropathy: Secondary | ICD-10-CM

## 2015-12-27 DIAGNOSIS — N401 Enlarged prostate with lower urinary tract symptoms: Secondary | ICD-10-CM

## 2015-12-27 DIAGNOSIS — N200 Calculus of kidney: Secondary | ICD-10-CM

## 2015-12-27 DIAGNOSIS — N400 Enlarged prostate without lower urinary tract symptoms: Secondary | ICD-10-CM | POA: Diagnosis not present

## 2015-12-27 NOTE — Progress Notes (Signed)
9:05 AM   Carl Williams 08-16-1950 [id number]  Referring provider: Idelle Crouch, MD [facility] [place], [place] [address]  Chief Complaint  Patient presents with  . Nephrolithiasis    6 month follow up and Results RUS  . Benign Prostatic Hypertrophy    HPI: Patient is 65 year old Caucasian male with a history of nephrolithiasis, right-sided pelvic caliectasis and BPH with LUTS who presents today for a 6 month follow-up.  Nephrolithiasis. Background Hx:  65 year old male with history of bilateral nephrolithiasis who initially presented found to have a LEFT 5 x 3 mm UVJ stone on CT scan who presented to our office for follow-up on 10/24/14. At that time, his left-sided flank pain had completely resolved but he developed acute onset severe right-sided flank pain. Given the severity of the pain, he was taken to the operating room following morning for bilateral retrograde pyelogram as well as RIGHT ureteral stent placement. Retrograde pyelogram at that time revealed a decompressed LEFT ureter. The RIGHT ureter had a filling defect within the distal ureter which was small and was hydronephrotic all the redundant that level. At the time of stent placement, significant purulent drainage was noted and incidentally he was noted to be mildly febrile in the preoperative holding area prior to this procedure. He was discharged home on antibiotics. His intraoperative urine culture was ultimately negative. He was noted to have acute renal failure with a creatinine of 2.16 at the time of stent placement.  At follow up visit, a KUB showed no evidence of residual stones in the RIGHT distal ureter and his stent was removed.  He underwent a follow-up renal ultrasound on 12/12/2014 and it noted mild right-sided hydronephrosis.   RUS from 12/20/2015 noted He is feeling well today.   The RUS noted no hydronephrosis.  Small simple right renal cyst.  No suspicious renal  masses.  Normal bladder.  Stable enlarged prostate,  Incidental diffuse hepatic steatosis.  He is feeling well today.  He specifically denies fevers, chills, nausea, vomiting, hematuria or flank pain.    BPH WITH LUTS His IPSS score today is 2, which is mild lower urinary tract symptomatology. He is pleased with his quality life due to his urinary symptoms.  He denies any dysuria, hematuria or suprapubic pain.   He currently taking tamsulosin 0.4 mg daily.  He also denies any recent fevers, chills, nausea or vomiting.  He does not have a family history of PCa.      IPSS      12/27/15 0800       International Prostate Symptom Score   How often have you had the sensation of not emptying your bladder? Not at All     How often have you had to urinate less than every two hours? Not at All     How often have you found you stopped and started again several times when you urinated? Not at All     How often have you found it difficult to postpone urination? Not at All     How often have you had a weak urinary stream? Less than 1 in 5 times     How often have you had to strain to start urination? Not at All     How many times did you typically get up at night to urinate? 1 Time     Total IPSS Score 2     Quality of Life due to urinary symptoms   If  you were to spend the rest of your life with your urinary condition just the way it is now how would you feel about that? Pleased        Score:  1-7 Mild 8-19 Moderate 20-35 Severe     PMH: Past Medical History  Diagnosis Date  . Memory difficulties   . Onychomycosis   . GERD (gastroesophageal reflux disease)   . Back pain, thoracic   . Diarrhea   . HLD (hyperlipidemia)   . PE (pulmonary embolism)   . Peptic ulcer   . Melanoma ([place])   . Fatigue   . Plantar fascial fibromatosis   . Pneumonia   . Hydronephrosis   . Kidney stone on left side   . Acute renal failure ([place])   . Right flank pain   . BPH (benign prostatic hyperplasia)     . Renal cyst     Surgical History: Past Surgical History  Procedure Laterality Date  . Tonsillectomy      Home Medications:    Medication List       This list is accurate as of: 12/27/15  9:05 AM.  Always use your most recent med list.               amphetamine-dextroamphetamine 10 MG tablet  Commonly known as:  ADDERALL  Take by mouth.     tamsulosin 0.4 MG Caps capsule  Commonly known as:  FLOMAX  Take 0.4 mg by mouth. Reported on 12/27/2015        Allergies:  Allergies  Allergen Reactions  . Ciprofloxacin Rash    Family History: Family History  Problem Relation Age of Onset  . Kidney disease Neg Hx   . Prostate cancer Neg Hx     Social History:  reports that he has never smoked. He does not have any smokeless tobacco history on file. He reports that he does not drink alcohol or use illicit drugs.  ROS: Urological Symptom Review  Patient is experiencing the following symptoms: Hard to postpone urination   Review of Systems  Gastrointestinal (upper)  : Negative for upper GI symptoms  Gastrointestinal (lower) : Negative for lower GI symptoms  Constitutional : Negative for symptoms  Skin: Negative for skin symptoms  Eyes: Negative for eye symptoms  Ear/Nose/Throat : Negative for Ear/Nose/Throat symptoms  Hematologic/Lymphatic: Negative for Hematologic/Lymphatic symptoms  Cardiovascular : Negative for cardiovascular symptoms  Respiratory : Negative for respiratory symptoms  Endocrine: Negative for endocrine symptoms  Musculoskeletal: Negative for musculoskeletal symptoms  Neurological: Negative for neurological symptoms  Psychologic: Negative for psychiatric symptoms   Physical Exam: BP 133/73 mmHg  Pulse 64  Ht 5\' 8"  (1.727 m)  Wt 173 lb 11.2 oz (78.79 kg)  BMI 26.42 kg/m2  Constitutional: Well nourished. Alert and oriented, No acute distress. HEENT: [place] AT, moist mucus membranes. Trachea midline, no  masses. Cardiovascular: No clubbing, cyanosis, or edema. Respiratory: Normal respiratory effort, no increased work of breathing. GI: Abdomen is soft, non tender, non distended, no abdominal masses. Liver and spleen not palpable.  No hernias appreciated.  Stool sample for occult testing is not indicated.   GU: No CVA tenderness.  No bladder fullness or masses.  Patient with circumcised phallus.  Urethral meatus is patent.  No penile discharge. No penile lesions or rashes. Scrotum without lesions, cysts, rashes and/or edema.  Testicles are located scrotally bilaterally. No masses are appreciated in the testicles. Left and right epididymis are normal. Rectal: Patient with  normal sphincter tone. Anus  and perineum without scarring or rashes. No rectal masses are appreciated. Prostate is approximately 70 grams, no nodules are appreciated. Seminal vesicles are normal. Skin: No rashes, bruises or suspicious lesions. Lymph: No cervical or inguinal adenopathy. Neurologic: Grossly intact, no focal deficits, moving all 4 extremities. Psychiatric: Normal mood and affect.  Laboratory Data: Lab Results  Component Value Date   WBC 13.3* 10/25/2014   HGB 13.6 10/25/2014   HCT 40.4 10/25/2014   MCV 90 10/25/2014   PLT 199 10/25/2014    Lab Results  Component Value Date   CREATININE 1.15 12/27/2014   PSA History  2.8 ng/mL on 12/27/2014  3.8 ng/mL on 12/27/2015     Pertinent Imaging: CLINICAL DATA: Hydronephrosis. History kidney stones.  EXAM: RENAL / URINARY TRACT ULTRASOUND COMPLETE  COMPARISON: None.  FINDINGS: Right Kidney:  Length: 10.6 cm. 1.1 x 0.9 x 1.2 cm right upper pole renal mass with increased through transmission most consistent with a cyst. Echogenicity within normal limits. Mild right pyelocaliectasis.  Left Kidney:  Length: 11.8 cm. Echogenicity within normal limits. No mass or hydronephrosis visualized.  Bladder:  Appears normal for degree of bladder  distention. Enlarged prostate gland measuring 4.7 x 4.5 x 4.7 cm.  IMPRESSION: 1. Mild right pelvicaliectasis. 2. No left hydronephrosis. 3. Prostatomegaly.   Electronically Signed  By: Kathreen Devoid  On: 06/21/2015 10:54  Assessment & Plan:    1. Mild right-sided pelvicaliectasis:   Resolved.   2. Nephrolithiasis:   Patient now has a history of stones.   Patient did undergo 24-hour urine testing.  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  He is managing this with diet.  He will follow up in one year.    3. BPH with LUTS  - IPSS score is 2/1  - Continue tamsulosin 0.4 mg daily   - RTC in 12 months for IPSS, PSA and exam   Return in about 1 year (around 12/26/2016) for IPSS score, PSA and exam.  Zara Council, PA-C  [facility] [address] [place], [place] [address] [phone]  Addendum:   Patient's PSA returned at 3.8.  He will return in one month for a repeated PSA.

## 2015-12-28 LAB — PSA: Prostate Specific Ag, Serum: 3.8 ng/mL (ref 0.0–4.0)

## 2015-12-30 ENCOUNTER — Telehealth: Payer: Self-pay

## 2015-12-30 DIAGNOSIS — N400 Enlarged prostate without lower urinary tract symptoms: Secondary | ICD-10-CM

## 2015-12-30 NOTE — Telephone Encounter (Signed)
Spoke with pt in reference to PSA results. Pt voiced understanding. Lab appt made. Orders placed.  

## 2015-12-30 NOTE — Telephone Encounter (Signed)
-----   Message from Nori Riis, PA-C sent at 12/29/2015 10:48 PM EDT ----- PSA has increased a little over 0.75 in one year.  I would like it repeated in one month.

## 2015-12-31 DIAGNOSIS — M25512 Pain in left shoulder: Secondary | ICD-10-CM | POA: Diagnosis not present

## 2015-12-31 DIAGNOSIS — M25612 Stiffness of left shoulder, not elsewhere classified: Secondary | ICD-10-CM | POA: Diagnosis not present

## 2015-12-31 DIAGNOSIS — M6281 Muscle weakness (generalized): Secondary | ICD-10-CM | POA: Diagnosis not present

## 2015-12-31 DIAGNOSIS — M7542 Impingement syndrome of left shoulder: Secondary | ICD-10-CM | POA: Diagnosis not present

## 2016-01-02 DIAGNOSIS — M25612 Stiffness of left shoulder, not elsewhere classified: Secondary | ICD-10-CM | POA: Diagnosis not present

## 2016-01-02 DIAGNOSIS — M6281 Muscle weakness (generalized): Secondary | ICD-10-CM | POA: Diagnosis not present

## 2016-01-02 DIAGNOSIS — M25512 Pain in left shoulder: Secondary | ICD-10-CM | POA: Diagnosis not present

## 2016-01-02 DIAGNOSIS — M7542 Impingement syndrome of left shoulder: Secondary | ICD-10-CM | POA: Diagnosis not present

## 2016-01-06 DIAGNOSIS — M25612 Stiffness of left shoulder, not elsewhere classified: Secondary | ICD-10-CM | POA: Diagnosis not present

## 2016-01-06 DIAGNOSIS — M7542 Impingement syndrome of left shoulder: Secondary | ICD-10-CM | POA: Diagnosis not present

## 2016-01-06 DIAGNOSIS — M25512 Pain in left shoulder: Secondary | ICD-10-CM | POA: Diagnosis not present

## 2016-01-06 DIAGNOSIS — M6281 Muscle weakness (generalized): Secondary | ICD-10-CM | POA: Diagnosis not present

## 2016-01-09 DIAGNOSIS — M6281 Muscle weakness (generalized): Secondary | ICD-10-CM | POA: Diagnosis not present

## 2016-01-09 DIAGNOSIS — M7542 Impingement syndrome of left shoulder: Secondary | ICD-10-CM | POA: Diagnosis not present

## 2016-01-09 DIAGNOSIS — M25512 Pain in left shoulder: Secondary | ICD-10-CM | POA: Diagnosis not present

## 2016-01-09 DIAGNOSIS — M25612 Stiffness of left shoulder, not elsewhere classified: Secondary | ICD-10-CM | POA: Diagnosis not present

## 2016-01-17 ENCOUNTER — Telehealth: Payer: Self-pay | Admitting: Urology

## 2016-01-17 DIAGNOSIS — E291 Testicular hypofunction: Secondary | ICD-10-CM

## 2016-01-17 NOTE — Telephone Encounter (Signed)
Carl Williams has an appointment for labs on July 26th and he's wondering if testosterone and estrogen can be added to the panel. He'd like a phone call regarding this.   Pt's ph# [phone] Thank you.

## 2016-01-17 NOTE — Telephone Encounter (Signed)
Please advise 

## 2016-01-20 NOTE — Telephone Encounter (Signed)
Please add the testosterone and estradiol to his PSA.

## 2016-01-20 NOTE — Telephone Encounter (Signed)
Spoke with pt and made aware labs were added to PSA.

## 2016-01-21 DIAGNOSIS — M25512 Pain in left shoulder: Secondary | ICD-10-CM | POA: Diagnosis not present

## 2016-01-21 DIAGNOSIS — M25612 Stiffness of left shoulder, not elsewhere classified: Secondary | ICD-10-CM | POA: Diagnosis not present

## 2016-01-21 DIAGNOSIS — M7542 Impingement syndrome of left shoulder: Secondary | ICD-10-CM | POA: Diagnosis not present

## 2016-01-21 DIAGNOSIS — M6281 Muscle weakness (generalized): Secondary | ICD-10-CM | POA: Diagnosis not present

## 2016-01-29 ENCOUNTER — Other Ambulatory Visit: Payer: BC Managed Care – PPO

## 2016-01-29 DIAGNOSIS — E291 Testicular hypofunction: Secondary | ICD-10-CM | POA: Diagnosis not present

## 2016-01-29 DIAGNOSIS — N400 Enlarged prostate without lower urinary tract symptoms: Secondary | ICD-10-CM

## 2016-01-30 ENCOUNTER — Telehealth: Payer: Self-pay

## 2016-01-30 NOTE — Telephone Encounter (Signed)
-----   Message from Nori Riis, PA-C sent at 01/30/2016  8:35 AM EDT ----- Please notify the patient that his labs are within normal limits.

## 2016-01-30 NOTE — Telephone Encounter (Signed)
Spoke with pt in reference to lab results. Pt voiced understanding.  

## 2016-01-31 LAB — ESTRADIOL: Estradiol: 22.8 pg/mL (ref 7.6–42.6)

## 2016-01-31 LAB — PSA: PROSTATE SPECIFIC AG, SERUM: 2.2 ng/mL (ref 0.0–4.0)

## 2016-01-31 LAB — TESTOSTERONE: TESTOSTERONE: 350 ng/dL (ref 264–916)

## 2016-02-19 DIAGNOSIS — M7522 Bicipital tendinitis, left shoulder: Secondary | ICD-10-CM | POA: Diagnosis not present

## 2016-02-20 DIAGNOSIS — N5319 Other ejaculatory dysfunction: Secondary | ICD-10-CM | POA: Diagnosis not present

## 2016-02-20 DIAGNOSIS — R351 Nocturia: Secondary | ICD-10-CM | POA: Diagnosis not present

## 2016-02-20 DIAGNOSIS — R3915 Urgency of urination: Secondary | ICD-10-CM | POA: Diagnosis not present

## 2016-02-20 DIAGNOSIS — R3914 Feeling of incomplete bladder emptying: Secondary | ICD-10-CM | POA: Diagnosis not present

## 2016-02-20 DIAGNOSIS — R3916 Straining to void: Secondary | ICD-10-CM | POA: Diagnosis not present

## 2016-02-20 DIAGNOSIS — R350 Frequency of micturition: Secondary | ICD-10-CM | POA: Diagnosis not present

## 2016-02-20 DIAGNOSIS — N401 Enlarged prostate with lower urinary tract symptoms: Secondary | ICD-10-CM | POA: Diagnosis not present

## 2016-02-20 DIAGNOSIS — E291 Testicular hypofunction: Secondary | ICD-10-CM | POA: Diagnosis not present

## 2016-02-20 DIAGNOSIS — N421 Congestion and hemorrhage of prostate: Secondary | ICD-10-CM | POA: Diagnosis not present

## 2016-02-20 DIAGNOSIS — N5201 Erectile dysfunction due to arterial insufficiency: Secondary | ICD-10-CM | POA: Diagnosis not present

## 2016-03-24 DIAGNOSIS — N5201 Erectile dysfunction due to arterial insufficiency: Secondary | ICD-10-CM | POA: Diagnosis not present

## 2016-03-24 DIAGNOSIS — E291 Testicular hypofunction: Secondary | ICD-10-CM | POA: Diagnosis not present

## 2016-03-24 DIAGNOSIS — R972 Elevated prostate specific antigen [PSA]: Secondary | ICD-10-CM | POA: Diagnosis not present

## 2016-04-22 DIAGNOSIS — D485 Neoplasm of uncertain behavior of skin: Secondary | ICD-10-CM | POA: Diagnosis not present

## 2016-04-22 DIAGNOSIS — C44719 Basal cell carcinoma of skin of left lower limb, including hip: Secondary | ICD-10-CM | POA: Diagnosis not present

## 2016-04-22 DIAGNOSIS — Z08 Encounter for follow-up examination after completed treatment for malignant neoplasm: Secondary | ICD-10-CM | POA: Diagnosis not present

## 2016-04-22 DIAGNOSIS — L820 Inflamed seborrheic keratosis: Secondary | ICD-10-CM | POA: Diagnosis not present

## 2016-04-22 DIAGNOSIS — L728 Other follicular cysts of the skin and subcutaneous tissue: Secondary | ICD-10-CM | POA: Diagnosis not present

## 2016-04-22 DIAGNOSIS — Z85828 Personal history of other malignant neoplasm of skin: Secondary | ICD-10-CM | POA: Diagnosis not present

## 2016-05-06 DIAGNOSIS — C44719 Basal cell carcinoma of skin of left lower limb, including hip: Secondary | ICD-10-CM | POA: Diagnosis not present

## 2016-05-13 DIAGNOSIS — E291 Testicular hypofunction: Secondary | ICD-10-CM | POA: Diagnosis not present

## 2016-05-13 DIAGNOSIS — N5201 Erectile dysfunction due to arterial insufficiency: Secondary | ICD-10-CM | POA: Diagnosis not present

## 2016-05-13 DIAGNOSIS — R972 Elevated prostate specific antigen [PSA]: Secondary | ICD-10-CM | POA: Diagnosis not present

## 2016-05-18 DIAGNOSIS — R972 Elevated prostate specific antigen [PSA]: Secondary | ICD-10-CM | POA: Diagnosis not present

## 2016-05-18 DIAGNOSIS — N401 Enlarged prostate with lower urinary tract symptoms: Secondary | ICD-10-CM | POA: Diagnosis not present

## 2016-05-21 DIAGNOSIS — M542 Cervicalgia: Secondary | ICD-10-CM | POA: Diagnosis not present

## 2016-05-21 DIAGNOSIS — M50321 Other cervical disc degeneration at C4-C5 level: Secondary | ICD-10-CM | POA: Diagnosis not present

## 2016-12-03 ENCOUNTER — Other Ambulatory Visit: Payer: Self-pay

## 2016-12-03 DIAGNOSIS — N401 Enlarged prostate with lower urinary tract symptoms: Secondary | ICD-10-CM

## 2016-12-14 ENCOUNTER — Telehealth: Payer: Self-pay | Admitting: Urology

## 2016-12-14 NOTE — Telephone Encounter (Signed)
Pt called to cancel lab and office appt and didn't want to reschedule.  Just F.Y.I.

## 2016-12-18 ENCOUNTER — Other Ambulatory Visit: Payer: BC Managed Care – PPO

## 2016-12-23 ENCOUNTER — Ambulatory Visit: Payer: BC Managed Care – PPO | Admitting: Urology

## 2016-12-25 ENCOUNTER — Ambulatory Visit: Payer: BC Managed Care – PPO | Admitting: Urology

## 2017-08-09 ENCOUNTER — Encounter: Payer: Self-pay | Admitting: Intensive Care

## 2017-08-09 ENCOUNTER — Emergency Department: Payer: Medicare Other

## 2017-08-09 ENCOUNTER — Other Ambulatory Visit: Payer: Self-pay

## 2017-08-09 ENCOUNTER — Inpatient Hospital Stay
Admission: EM | Admit: 2017-08-09 | Discharge: 2017-08-12 | DRG: 194 | Disposition: A | Discharge status: Home / Self Care | Payer: Medicare Other | Attending: Internal Medicine | Admitting: Internal Medicine

## 2017-08-09 DIAGNOSIS — Z86711 Personal history of pulmonary embolism: Secondary | ICD-10-CM

## 2017-08-09 DIAGNOSIS — J101 Influenza due to other identified influenza virus with other respiratory manifestations: Secondary | ICD-10-CM | POA: Diagnosis present

## 2017-08-09 DIAGNOSIS — Z79899 Other long term (current) drug therapy: Secondary | ICD-10-CM

## 2017-08-09 DIAGNOSIS — I951 Orthostatic hypotension: Secondary | ICD-10-CM | POA: Diagnosis present

## 2017-08-09 DIAGNOSIS — Z87442 Personal history of urinary calculi: Secondary | ICD-10-CM

## 2017-08-09 DIAGNOSIS — N182 Chronic kidney disease, stage 2 (mild): Secondary | ICD-10-CM | POA: Diagnosis present

## 2017-08-09 DIAGNOSIS — N400 Enlarged prostate without lower urinary tract symptoms: Secondary | ICD-10-CM | POA: Diagnosis present

## 2017-08-09 DIAGNOSIS — N179 Acute kidney failure, unspecified: Secondary | ICD-10-CM | POA: Diagnosis present

## 2017-08-09 DIAGNOSIS — E785 Hyperlipidemia, unspecified: Secondary | ICD-10-CM | POA: Diagnosis present

## 2017-08-09 DIAGNOSIS — Z85820 Personal history of malignant melanoma of skin: Secondary | ICD-10-CM | POA: Diagnosis not present

## 2017-08-09 DIAGNOSIS — K219 Gastro-esophageal reflux disease without esophagitis: Secondary | ICD-10-CM | POA: Diagnosis present

## 2017-08-09 DIAGNOSIS — R42 Dizziness and giddiness: Secondary | ICD-10-CM | POA: Diagnosis not present

## 2017-08-09 DIAGNOSIS — E861 Hypovolemia: Secondary | ICD-10-CM | POA: Diagnosis present

## 2017-08-09 DIAGNOSIS — I9589 Other hypotension: Secondary | ICD-10-CM

## 2017-08-09 LAB — URINALYSIS, COMPLETE (UACMP) WITH MICROSCOPIC
Bacteria, UA: NONE SEEN
Bilirubin Urine: NEGATIVE
GLUCOSE, UA: NEGATIVE mg/dL
Hgb urine dipstick: NEGATIVE
KETONES UR: NEGATIVE mg/dL
Leukocytes, UA: NEGATIVE
NITRITE: NEGATIVE
PH: 5 (ref 5.0–8.0)
Protein, ur: NEGATIVE mg/dL
Specific Gravity, Urine: 1.023 (ref 1.005–1.030)

## 2017-08-09 LAB — COMPREHENSIVE METABOLIC PANEL
ALK PHOS: 64 U/L (ref 38–126)
ALT: 17 U/L (ref 17–63)
AST: 26 U/L (ref 15–41)
Albumin: 4.3 g/dL (ref 3.5–5.0)
Anion gap: 10 (ref 5–15)
BUN: 21 mg/dL — ABNORMAL HIGH (ref 6–20)
CALCIUM: 9.4 mg/dL (ref 8.9–10.3)
CHLORIDE: 104 mmol/L (ref 101–111)
CO2: 24 mmol/L (ref 22–32)
CREATININE: 1.44 mg/dL — AB (ref 0.61–1.24)
GFR, EST AFRICAN AMERICAN: 56 mL/min — AB (ref >60)
GFR, EST NON AFRICAN AMERICAN: 48 mL/min — AB (ref >60)
Glucose, Bld: 103 mg/dL — ABNORMAL HIGH (ref 65–99)
Potassium: 3.9 mmol/L (ref 3.5–5.1)
Sodium: 138 mmol/L (ref 135–145)
Total Bilirubin: 0.6 mg/dL (ref 0.3–1.2)
Total Protein: 7.6 g/dL (ref 6.5–8.1)

## 2017-08-09 LAB — CBC WITH DIFFERENTIAL/PLATELET
BASOS ABS: 0.1 10*3/uL (ref 0–0.1)
Basophils Relative: 1 %
Eosinophils Absolute: 0 10*3/uL (ref 0–0.7)
Eosinophils Relative: 0 %
HEMATOCRIT: 45.9 % (ref 40.0–52.0)
HEMOGLOBIN: 15.5 g/dL (ref 13.0–18.0)
LYMPHS ABS: 0.8 10*3/uL — AB (ref 1.0–3.6)
LYMPHS PCT: 11 %
MCH: 30.3 pg (ref 26.0–34.0)
MCHC: 33.8 g/dL (ref 32.0–36.0)
MCV: 89.6 fL (ref 80.0–100.0)
Monocytes Absolute: 1.2 10*3/uL — ABNORMAL HIGH (ref 0.2–1.0)
Monocytes Relative: 16 %
NEUTROS ABS: 5.5 10*3/uL (ref 1.4–6.5)
Neutrophils Relative %: 72 %
PLATELETS: 194 10*3/uL (ref 150–440)
RBC: 5.12 MIL/uL (ref 4.40–5.90)
RDW: 13.3 % (ref 11.5–14.5)
WBC: 7.6 10*3/uL (ref 3.8–10.6)

## 2017-08-09 LAB — TROPONIN I

## 2017-08-09 LAB — INFLUENZA PANEL BY PCR (TYPE A & B)
Influenza A By PCR: POSITIVE — AB
Influenza B By PCR: NEGATIVE

## 2017-08-09 LAB — APTT: APTT: 31 s (ref 24–36)

## 2017-08-09 LAB — PROTIME-INR
INR: 1.05
Prothrombin Time: 13.6 seconds (ref 11.4–15.2)

## 2017-08-09 MED ORDER — ENOXAPARIN SODIUM 40 MG/0.4ML ~~LOC~~ SOLN
40.0000 mg | SUBCUTANEOUS | Status: DC
  Administered 2017-08-09 – 2017-08-11 (×3): 40 mg via SUBCUTANEOUS
  Filled 2017-08-09 (×3): qty 0.4

## 2017-08-09 MED ORDER — OSELTAMIVIR PHOSPHATE 75 MG PO CAPS: 75.0000 mg | ORAL_CAPSULE | Freq: Two times a day (BID) | ORAL | Status: DC

## 2017-08-09 MED ORDER — OSELTAMIVIR PHOSPHATE 30 MG PO CAPS
30.0000 mg | ORAL_CAPSULE | Freq: Two times a day (BID) | ORAL | Status: DC
  Administered 2017-08-09 – 2017-08-11 (×4): 30 mg via ORAL
  Filled 2017-08-09 (×4): qty 1

## 2017-08-09 MED ORDER — IPRATROPIUM-ALBUTEROL 0.5-2.5 (3) MG/3ML IN SOLN
3.0000 mL | Freq: Four times a day (QID) | RESPIRATORY_TRACT | Status: DC
  Administered 2017-08-09 – 2017-08-10 (×4): 3 mL via RESPIRATORY_TRACT
  Filled 2017-08-09 (×5): qty 3

## 2017-08-09 MED ORDER — IBUPROFEN 400 MG PO TABS
600.0000 mg | ORAL_TABLET | Freq: Four times a day (QID) | ORAL | Status: DC | PRN
  Filled 2017-08-09: qty 2

## 2017-08-09 MED ORDER — BUDESONIDE 0.5 MG/2ML IN SUSP
0.5000 mg | Freq: Two times a day (BID) | RESPIRATORY_TRACT | Status: DC
  Administered 2017-08-09 – 2017-08-11 (×4): 0.5 mg via RESPIRATORY_TRACT
  Filled 2017-08-09 (×4): qty 2

## 2017-08-09 MED ORDER — ACETAMINOPHEN 325 MG PO TABS
650.0000 mg | ORAL_TABLET | Freq: Four times a day (QID) | ORAL | Status: DC | PRN
  Administered 2017-08-10 – 2017-08-11 (×2): 650 mg via ORAL
  Filled 2017-08-09 (×2): qty 2

## 2017-08-09 MED ORDER — SODIUM CHLORIDE 0.9 % IV SOLN
INTRAVENOUS | Status: DC
  Administered 2017-08-09 – 2017-08-12 (×7): via INTRAVENOUS

## 2017-08-09 MED ORDER — SODIUM CHLORIDE 0.9 % IV SOLN: INTRAVENOUS | Status: DC

## 2017-08-09 MED ORDER — SODIUM CHLORIDE 0.9 % IV BOLUS (SEPSIS)
1000.0000 mL | Freq: Once | INTRAVENOUS | Status: AC
Start: 2017-08-09 — End: 2017-08-09
  Administered 2017-08-09: 1000 mL via INTRAVENOUS

## 2017-08-09 MED ORDER — ACETAMINOPHEN 325 MG PO TABS
650.0000 mg | ORAL_TABLET | Freq: Once | ORAL | Status: AC | PRN
  Administered 2017-08-09: 650 mg via ORAL

## 2017-08-09 MED ORDER — ONDANSETRON HCL 4 MG/2ML IJ SOLN
4.0000 mg | Freq: Four times a day (QID) | INTRAMUSCULAR | Status: DC | PRN
  Administered 2017-08-10: 4 mg via INTRAVENOUS
  Filled 2017-08-09: qty 2

## 2017-08-09 MED ORDER — IBUPROFEN 600 MG PO TABS
600.0000 mg | ORAL_TABLET | Freq: Once | ORAL | Status: AC
  Administered 2017-08-09: 600 mg via ORAL
  Filled 2017-08-09: qty 1

## 2017-08-09 MED ORDER — OSELTAMIVIR PHOSPHATE 75 MG PO CAPS
75.0000 mg | ORAL_CAPSULE | Freq: Once | ORAL | Status: AC
  Administered 2017-08-09: 75 mg via ORAL
  Filled 2017-08-09: qty 1

## 2017-08-09 MED ORDER — ACETAMINOPHEN 650 MG RE SUPP: 650.0000 mg | Freq: Four times a day (QID) | RECTAL | Status: DC | PRN

## 2017-08-09 MED ORDER — ONDANSETRON HCL 4 MG PO TABS: 4.0000 mg | ORAL_TABLET | Freq: Four times a day (QID) | ORAL | Status: DC | PRN

## 2017-08-09 MED ORDER — ACETAMINOPHEN 325 MG PO TABS
ORAL_TABLET | ORAL | Status: AC
  Administered 2017-08-09: 650 mg via ORAL
  Filled 2017-08-09: qty 2

## 2017-08-09 NOTE — Progress Notes (Signed)
PHARMACY NOTE:  ANTIMICROBIAL RENAL DOSAGE ADJUSTMENT  Current antimicrobial regimen includes a mismatch between antimicrobial dosage and estimated renal function.  As per policy approved by the Pharmacy & Therapeutics and Medical Executive Committees, the antimicrobial dosage will be adjusted accordingly.  Current antimicrobial dosage:  Tamiflu 75 mg po BID  Indication: influenza  Renal Function:  Estimated Creatinine Clearance: 47.5 mL/min (A) (by C-G formula based on SCr of 1.44 mg/dL (H)).     Antimicrobial dosage has been changed to:   Tamiflu 30 mg po BID for Crcl 30- 60 ml/min  Additional comments:   Thank you for allowing pharmacy to be a part of this patient's care.  [place], [facility] 08/09/2017 2:06 PM

## 2017-08-09 NOTE — ED Notes (Addendum)
Patient transported to CT 

## 2017-08-09 NOTE — ED Provider Notes (Signed)
[facility] Emergency Department Provider Note    None    (approximate)  I have reviewed the triage vital signs and the nursing notes.   HISTORY  Chief Complaint Dizziness    HPI ZAMIR STAPLES is a 67 y.o. male with a history of renal failure anemia and PE not currently on any blood thinners presents with dizziness and light vision started last night.  States he did have a frontal headache last night.  No neck pain.  No numbness or tingling.  No chest pain or shortness of breath.  No nausea or vomiting.  He has never had pain like this before.  Past Medical History:  Diagnosis Date  . Acute renal failure ([place])   . Back pain, thoracic   . BPH (benign prostatic hyperplasia)   . Diarrhea   . Fatigue   . GERD (gastroesophageal reflux disease)   . HLD (hyperlipidemia)   . Hydronephrosis   . Kidney stone on left side   . Melanoma ([place])   . Memory difficulties   . Onychomycosis   . PE (pulmonary embolism)   . Peptic ulcer   . Plantar fascial fibromatosis   . Pneumonia   . Renal cyst   . Right flank pain    Family History  Problem Relation Age of Onset  . Kidney disease Neg Hx   . Prostate cancer Neg Hx    Past Surgical History:  Procedure Laterality Date  . TONSILLECTOMY     Patient Active Problem List   Diagnosis Date Noted  . Pelvicaliectasis 06/28/2015  . Nephrolithiasis 06/28/2015  . Kidney stones 12/27/2014  . Hydronephrosis with urinary obstruction due to ureteral calculus 12/27/2014  . BPH with obstruction/lower urinary tract symptoms 12/27/2014      Prior to Admission medications   Not on File    Allergies Ciprofloxacin    Social History Social History   Tobacco Use  . Smoking status: Never Smoker  Substance Use Topics  . Alcohol use: No    Alcohol/week: 0.0 oz  . Drug use: No    Review of Systems Patient denies headaches, rhinorrhea, blurry vision, numbness, shortness of breath, chest pain, edema, cough,  abdominal pain, nausea, vomiting, diarrhea, dysuria, fevers, rashes or hallucinations unless otherwise stated above in HPI. ____________________________________________   PHYSICAL EXAM:  VITAL SIGNS: Vitals:   08/09/17 1204 08/09/17 1350  BP: 108/72   Pulse: 96   Resp: 16   Temp:  (!) 103.1 F (39.5 C)  SpO2: 94%     Constitutional: Alert and oriented. Well appearing and in no acute distress. Eyes: Conjunctivae are normal.  Head: Atraumatic. Nose: No congestion/rhinnorhea. Mouth/Throat: Mucous membranes are moist.   Neck: No stridor. Painless ROM.  Cardiovascular: Normal rate, regular rhythm. Grossly normal heart sounds.  Good peripheral circulation. Respiratory: Normal respiratory effort.  No retractions. Lungs CTAB. Gastrointestinal: Soft and nontender. No distention. No abdominal bruits. No CVA tenderness. Genitourinary:  Musculoskeletal: No lower extremity tenderness nor edema.  No joint effusions. Neurologic:  CN- intact.  No facial droop, Normal FNF.  Normal heel to shin.  Sensation intact bilaterally. Normal speech and language. No gross focal neurologic deficits are appreciated. No gait instability.  Skin:  Skin is warm, dry and intact. No rash noted. Psychiatric: Mood and affect are normal. Speech and behavior are normal.  ____________________________________________   LABS (all labs ordered are listed, but only abnormal results are displayed)  Results for orders placed or performed during the hospital encounter of  08/09/17 (from the past 24 hour(s))  Troponin I     Status: None   Collection Time: 08/09/17 12:02 PM  Result Value Ref Range   Troponin I <0.03 <0.03 ng/mL  CBC with Differential/Platelet     Status: Abnormal   Collection Time: 08/09/17 12:02 PM  Result Value Ref Range   WBC 7.6 3.8 - 10.6 K/uL   RBC 5.12 4.40 - 5.90 MIL/uL   Hemoglobin 15.5 13.0 - 18.0 g/dL   HCT 45.9 40.0 - 52.0 %   MCV 89.6 80.0 - 100.0 fL   MCH 30.3 26.0 - 34.0 pg   MCHC  33.8 32.0 - 36.0 g/dL   RDW 13.3 11.5 - 14.5 %   Platelets 194 150 - 440 K/uL   Neutrophils Relative % 72 %   Neutro Abs 5.5 1.4 - 6.5 K/uL   Lymphocytes Relative 11 %   Lymphs Abs 0.8 (L) 1.0 - 3.6 K/uL   Monocytes Relative 16 %   Monocytes Absolute 1.2 (H) 0.2 - 1.0 K/uL   Eosinophils Relative 0 %   Eosinophils Absolute 0.0 0 - 0.7 K/uL   Basophils Relative 1 %   Basophils Absolute 0.1 0 - 0.1 K/uL  Comprehensive metabolic panel     Status: Abnormal   Collection Time: 08/09/17 12:02 PM  Result Value Ref Range   Sodium 138 135 - 145 mmol/L   Potassium 3.9 3.5 - 5.1 mmol/L   Chloride 104 101 - 111 mmol/L   CO2 24 22 - 32 mmol/L   Glucose, Bld 103 (H) 65 - 99 mg/dL   BUN 21 (H) 6 - 20 mg/dL   Creatinine, Ser 1.44 (H) 0.61 - 1.24 mg/dL   Calcium 9.4 8.9 - 10.3 mg/dL   Total Protein 7.6 6.5 - 8.1 g/dL   Albumin 4.3 3.5 - 5.0 g/dL   AST 26 15 - 41 U/L   ALT 17 17 - 63 U/L   Alkaline Phosphatase 64 38 - 126 U/L   Total Bilirubin 0.6 0.3 - 1.2 mg/dL   GFR calc non Af Amer 48 (L) >60 mL/min   GFR calc Af Amer 56 (L) >60 mL/min   Anion gap 10 5 - 15  Protime-INR     Status: None   Collection Time: 08/09/17 12:34 PM  Result Value Ref Range   Prothrombin Time 13.6 11.4 - 15.2 seconds   INR 1.05   APTT     Status: None   Collection Time: 08/09/17 12:34 PM  Result Value Ref Range   aPTT 31 24 - 36 seconds  Influenza panel by PCR (type A & B)     Status: Abnormal   Collection Time: 08/09/17 12:47 PM  Result Value Ref Range   Influenza A By PCR POSITIVE (A) NEGATIVE   Influenza B By PCR NEGATIVE NEGATIVE   ____________________________________________  EKG My review and personal interpretation at Time: 11:54   Indication: dizziness  Rate: 99  Rhythm: sinus Axis: normal Other: st elevation in V2>V1 without reciprocal changes   My review and personal interpretation at Time: 12:06   Indication: dizziness  Rate: 95  Rhythm: sinus Axis: normal Other: unchanged from  previous ____________________________________________  RADIOLOGY  I personally reviewed all radiographic images ordered to evaluate for the above acute complaints and reviewed radiology reports and findings.  These findings were personally discussed with the patient.  Please see medical record for radiology report.  ____________________________________________   PROCEDURES  Procedure(s) performed:  Procedures    Critical Care  performed: no ____________________________________________   INITIAL IMPRESSION / ASSESSMENT AND PLAN / ED COURSE  Pertinent labs & imaging results that were available during my care of the patient were reviewed by me and considered in my medical decision making (see chart for details).  DDX: cva, sdh, iph, sah, mass, acs, dysrhythjmia  LEVAUGHN PUCCINELLI is a 67 y.o. who presents to the ED with above.  Patient brought immediately back to room 3 due to concern for ST elevations on his EKG.  In route patient denies any chest pain symptoms seem to be primarily related to blurry vision and headache started last night.  Will take emergently to CT head to evaluate for acute intracranial process.  Denies any chest pain or shortness of breath at this time.  We will continue to monitor on cardiac monitor.  The patient will be placed on continuous pulse oximetry and telemetry for monitoring.  Laboratory evaluation will be sent to evaluate for the above complaints.     Clinical Course as of Aug 09 1418  Mon Aug 09, 2017  1238 Patient reassessed.  Remains He[place] dynamically stable.  CT shows no acute abnormality.  Denies any chest pain or pressure.  Delta EKG unchanged and on review of medical records it does appear that he had an incomplete right bundle in 2006 which is roughly the same.  [PR]  1420 Patient with evidence of influenza.  Patient found to be with fever to 103 was noted to be orthostatic down to the 35W systolic.  Based on his frailty factors sudden onset of  symptoms and orthostasis I do believe patient would benefit from admission for IV fluids and hemodynamic monitoring.  [PR]    Clinical Course User Index [PR] Merlyn Lot, MD     ____________________________________________   FINAL CLINICAL IMPRESSION(S) / ED DIAGNOSES  Final diagnoses:  Dizziness  Influenza A  Hypotension due to hypovolemia      NEW MEDICATIONS STARTED DURING THIS VISIT:  New Prescriptions   No medications on file     Note:  This document was prepared using Dragon voice recognition software and may include unintentional dictation errors.    Merlyn Lot, MD 08/09/17 1420
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
## 2017-08-09 NOTE — ED Notes (Signed)
Report to Annabella, RN  

## 2017-08-09 NOTE — ED Triage Notes (Signed)
Patient c/o dizziness that started late last night. Patient also c/o blurry vision. Denies chest pain or SOB

## 2017-08-10 LAB — CBC
HEMATOCRIT: 37 % — AB (ref 40.0–52.0)
Hemoglobin: 12.7 g/dL — ABNORMAL LOW (ref 13.0–18.0)
MCH: 31 pg (ref 26.0–34.0)
MCHC: 34.3 g/dL (ref 32.0–36.0)
MCV: 90.5 fL (ref 80.0–100.0)
PLATELETS: 150 10*3/uL (ref 150–440)
RBC: 4.09 MIL/uL — ABNORMAL LOW (ref 4.40–5.90)
RDW: 13.4 % (ref 11.5–14.5)
WBC: 6.4 10*3/uL (ref 3.8–10.6)

## 2017-08-10 LAB — BASIC METABOLIC PANEL
Anion gap: 7 (ref 5–15)
BUN: 23 mg/dL — AB (ref 6–20)
CALCIUM: 7.9 mg/dL — AB (ref 8.9–10.3)
CO2: 23 mmol/L (ref 22–32)
CREATININE: 1.32 mg/dL — AB (ref 0.61–1.24)
Chloride: 109 mmol/L (ref 101–111)
GFR calc Af Amer: >60 mL/min (ref >60)
GFR, EST NON AFRICAN AMERICAN: 54 mL/min — AB (ref >60)
GLUCOSE: 109 mg/dL — AB (ref 65–99)
POTASSIUM: 3.8 mmol/L (ref 3.5–5.1)
SODIUM: 139 mmol/L (ref 135–145)

## 2017-08-10 MED ORDER — CALCIUM CARBONATE ANTACID 500 MG PO CHEW: 1.0000 | CHEWABLE_TABLET | Freq: Three times a day (TID) | ORAL | Status: DC | PRN

## 2017-08-10 MED ORDER — PHENOL 1.4 % MT LIQD
2.0000 | OROMUCOSAL | Status: DC | PRN
  Administered 2017-08-10: 2 via OROMUCOSAL
  Filled 2017-08-10: qty 177

## 2017-08-10 MED ORDER — IBUPROFEN 400 MG PO TABS
400.0000 mg | ORAL_TABLET | Freq: Four times a day (QID) | ORAL | Status: DC | PRN
  Administered 2017-08-10: 400 mg via ORAL

## 2017-08-10 MED ORDER — PANTOPRAZOLE SODIUM 40 MG PO TBEC
40.0000 mg | DELAYED_RELEASE_TABLET | Freq: Every day | ORAL | Status: DC | PRN
  Administered 2017-08-10: 40 mg via ORAL
  Filled 2017-08-10: qty 1

## 2017-08-10 MED ORDER — PROMETHAZINE HCL 25 MG/ML IJ SOLN
12.5000 mg | Freq: Four times a day (QID) | INTRAMUSCULAR | Status: DC | PRN
  Administered 2017-08-10: 12.5 mg via INTRAVENOUS
  Filled 2017-08-10: qty 1

## 2017-08-10 MED ORDER — IPRATROPIUM-ALBUTEROL 0.5-2.5 (3) MG/3ML IN SOLN
3.0000 mL | RESPIRATORY_TRACT | Status: DC
  Administered 2017-08-10 – 2017-08-11 (×6): 3 mL via RESPIRATORY_TRACT
  Filled 2017-08-10 (×5): qty 3

## 2017-08-10 MED ORDER — SODIUM CHLORIDE 0.9 % IV BOLUS (SEPSIS)
500.0000 mL | Freq: Once | INTRAVENOUS | Status: AC
  Administered 2017-08-10: 500 mL via INTRAVENOUS

## 2017-08-10 NOTE — Progress Notes (Signed)
[facility] [place] NAME: Carl Williams    MR#:  [id number]  DATE OF BIRTH:  1951/04/23  SUBJECTIVE:  CHIEF COMPLAINT:   Chief Complaint  Patient presents with  . Dizziness     Came with fever, dizziness- found to have flu. Still have fever, nausea, hypotension, chills. REVIEW OF SYSTEMS:  CONSTITUTIONAL: positive for fever, fatigue or weakness.  EYES: No blurred or double vision.  EARS, NOSE, AND THROAT: No tinnitus or ear pain.  RESPIRATORY: have cough, shortness of breath, wheezing or hemoptysis.  CARDIOVASCULAR: No chest pain, orthopnea, edema.  GASTROINTESTINAL: No nausea, vomiting, diarrhea or abdominal pain.  GENITOURINARY: No dysuria, hematuria.  ENDOCRINE: No polyuria, nocturia,  HEMATOLOGY: No anemia, easy bruising or bleeding SKIN: No rash or lesion. MUSCULOSKELETAL: No joint pain or arthritis.   NEUROLOGIC: No tingling, numbness, weakness.  PSYCHIATRY: No anxiety or depression.   ROS  DRUG ALLERGIES:   Allergies  Allergen Reactions  . Ciprofloxacin Rash    VITALS:  Blood pressure (!) 101/51, pulse 72, temperature (!) 102.1 F (38.9 C), temperature source Oral, resp. rate 20, height 5\' 8"  (1.727 m), weight 73.5 kg (162 lb), SpO2 95 %.  PHYSICAL EXAMINATION:  GENERAL:  67 y.o.-year-old patient lying in the bed with no acute distress.  EYES: Pupils equal, round, reactive to light and accommodation. No scleral icterus. Extraocular muscles intact.  HEENT: Head atraumatic, normocephalic. Oropharynx and nasopharynx clear.  NECK:  Supple, no jugular venous distention. No thyroid enlargement, no tenderness.  LUNGS: Normal breath sounds bilaterally, some wheezing, no crepitation. No use of accessory muscles of respiration.  CARDIOVASCULAR: S1, S2 normal. No murmurs, rubs, or gallops.  ABDOMEN: Soft, nontender, nondistended. Bowel sounds present. No organomegaly or mass.  EXTREMITIES: No pedal edema, cyanosis, or clubbing.   NEUROLOGIC: Cranial nerves II through XII are intact. Muscle strength 4-5/5 in all extremities. Sensation intact. Gait not checked.  PSYCHIATRIC: The patient is alert and oriented x 3.  SKIN: No obvious rash, lesion, or ulcer.   Physical Exam LABORATORY PANEL:   CBC Recent Labs  Lab 08/10/17 0341  WBC 6.4  HGB 12.7*  HCT 37.0*  PLT 150   ------------------------------------------------------------------------------------------------------------------  Chemistries  Recent Labs  Lab 08/09/17 1202 08/10/17 0341  NA 138 139  K 3.9 3.8  CL 104 109  CO2 24 23  GLUCOSE 103* 109*  BUN 21* 23*  CREATININE 1.44* 1.32*  CALCIUM 9.4 7.9*  AST 26  --   ALT 17  --   ALKPHOS 64  --   BILITOT 0.6  --    ------------------------------------------------------------------------------------------------------------------  Cardiac Enzymes Recent Labs  Lab 08/09/17 1202  TROPONINI <0.03   ------------------------------------------------------------------------------------------------------------------  RADIOLOGY:  Ct Head Wo Contrast  Result Date: 08/09/2017 CLINICAL DATA:  Altered level of consciousness. EXAM: CT HEAD WITHOUT CONTRAST TECHNIQUE: Contiguous axial images were obtained from the base of the skull through the vertex without intravenous contrast. COMPARISON:  MRI head 12/17/2012 FINDINGS: Brain: Ventricle size and cerebral volume normal. Scattered white matter hypodensities have progressed since the prior study. These appear chronic. Negative for acute infarct, hemorrhage, or mass. Mega cisterna magna again noted. Vascular: Negative for hyperdense vessel Skull: Negative Sinuses/Orbits: Negative Other: None IMPRESSION: No acute abnormality. 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 Electronically Signed   By: Franchot Gallo M.D.   On: 08/09/2017 12:28   Dg Chest Port 1 View  Result Date: 08/09/2017 CLINICAL DATA:  Dizziness.  Evaluate for  CHF.  EXAM: PORTABLE CHEST 1 VIEW COMPARISON:  Chest x-ray dated September 28, 2005. FINDINGS: The heart size and mediastinal contours are within normal limits. Normal pulmonary vascularity. No focal consolidation, pleural effusion, or pneumothorax. No acute osseous abnormality. IMPRESSION: No active disease. Electronically Signed   By: Titus Dubin M.D.   On: 08/09/2017 12:41    ASSESSMENT AND PLAN:   Active Problems:   Influenza A  1.  Influenza A positive, fever, weakness and orthostatic hypotension.    On Tamiflu and give IV fluid hydration.   Tylenol PRN for fever. 2.  Acute kidney injury on chronic kidney disease stage II.  Give IV fluid hydration. 3.  Cough and rhonchi in the lungs start nebulizer treatments with budesonide and DuoNeb 4. Hypotension     Due to Influenza, IV fluids.    All the records are reviewed and case discussed with Care Management/Social Workerr. Management plans discussed with the patient, family and they are in agreement.  CODE STATUS: Full code.  TOTAL TIME TAKING CARE OF THIS PATIENT: 35 minutes.    POSSIBLE D/C IN 1-2 DAYS, DEPENDING ON CLINICAL CONDITION.   Vaughan Basta M.D on 08/10/2017   Between 7am to 6pm - Pager - [phone]  After 6pm go to [email or web address] - password [email or web address] [facility]  Office  [phone]  CC: Primary care physician; Idelle Crouch, MD  Note: This dictation was prepared with Dragon dictation along with smaller phrase technology. Any transcriptional errors that result from this process are unintentional.

## 2017-08-10 NOTE — Progress Notes (Signed)
Notified Dr. Estanislado Pandy of patient having sore throat. New order is put in at this time.

## 2017-08-10 NOTE — Progress Notes (Signed)
Patient BP was 86/45. Dr. Estanislado Pandy was notified and 588ml NS was ordered. Will continue to monitor.

## 2017-08-10 NOTE — Progress Notes (Signed)
Patient BP is 83/53 after receiving 540ml bolus of NS. Dr. pyreddy was notified and another 539ml bolus of NS is ordered. Will continue to monitor.

## 2017-08-10 NOTE — Progress Notes (Signed)
Patient had temp. Of 100.6 with chills. Dr. Estanislado Pandy was made aware of and tylenol 650mg  was ordered. Will continue to monitor patient after dose is given.

## 2017-08-10 NOTE — Progress Notes (Signed)
This is to inform Carl Williams,Carl Williams- DOB- 1951/05/08 is admitted in [facility] center on 08/09/17 and still staying here as inpatient tonight for his medical reasons. I anticipate, him being not able to resume his work for next 3-4 days after being discharged from hospital.  We may update again at the time of discharge, if needed. For any further questions please call [phone].  Thanks -Dr.Esker Dever Anselm Jungling

## 2017-08-11 LAB — BASIC METABOLIC PANEL
Anion gap: 5 (ref 5–15)
BUN: 16 mg/dL (ref 6–20)
CALCIUM: 7.7 mg/dL — AB (ref 8.9–10.3)
CO2: 23 mmol/L (ref 22–32)
Chloride: 112 mmol/L — ABNORMAL HIGH (ref 101–111)
Creatinine, Ser: 1.06 mg/dL (ref 0.61–1.24)
GFR calc Af Amer: >60 mL/min (ref >60)
GLUCOSE: 108 mg/dL — AB (ref 65–99)
POTASSIUM: 3.5 mmol/L (ref 3.5–5.1)
Sodium: 140 mmol/L (ref 135–145)

## 2017-08-11 LAB — CBC
HEMATOCRIT: 34.1 % — AB (ref 40.0–52.0)
Hemoglobin: 11.7 g/dL — ABNORMAL LOW (ref 13.0–18.0)
MCH: 30.8 pg (ref 26.0–34.0)
MCHC: 34.3 g/dL (ref 32.0–36.0)
MCV: 89.8 fL (ref 80.0–100.0)
Platelets: 133 10*3/uL — ABNORMAL LOW (ref 150–440)
RBC: 3.79 MIL/uL — ABNORMAL LOW (ref 4.40–5.90)
RDW: 13.5 % (ref 11.5–14.5)
WBC: 4.1 10*3/uL (ref 3.8–10.6)

## 2017-08-11 MED ORDER — SODIUM CHLORIDE 0.9 % IV BOLUS (SEPSIS)
250.0000 mL | Freq: Once | INTRAVENOUS | Status: AC
  Administered 2017-08-11: 250 mL via INTRAVENOUS

## 2017-08-11 MED ORDER — OSELTAMIVIR PHOSPHATE 75 MG PO CAPS
75.0000 mg | ORAL_CAPSULE | Freq: Two times a day (BID) | ORAL | Status: DC
  Administered 2017-08-11 – 2017-08-12 (×2): 75 mg via ORAL
  Filled 2017-08-11 (×2): qty 1

## 2017-08-11 NOTE — Progress Notes (Signed)
[facility] at [place] NAME: Carl Williams    MR#:  [id number]  DATE OF BIRTH:  Aug 20, 1950  SUBJECTIVE:  CHIEF COMPLAINT:   Chief Complaint  Patient presents with  . Dizziness     Came with fever, dizziness- found to have flu. Still have fever, nausea, hypotension, chills. Respiratory status improved, still had chills and feels somewhat generalized weakness.  REVIEW OF SYSTEMS:  CONSTITUTIONAL: positive for fever, fatigue or weakness.  EYES: No blurred or double vision.  EARS, NOSE, AND THROAT: No tinnitus or ear pain.  RESPIRATORY: have cough, shortness of breath, wheezing or hemoptysis.  CARDIOVASCULAR: No chest pain, orthopnea, edema.  GASTROINTESTINAL: No nausea, vomiting, diarrhea or abdominal pain.  GENITOURINARY: No dysuria, hematuria.  ENDOCRINE: No polyuria, nocturia,  HEMATOLOGY: No anemia, easy bruising or bleeding SKIN: No rash or lesion. MUSCULOSKELETAL: No joint pain or arthritis.   NEUROLOGIC: No tingling, numbness, weakness.  PSYCHIATRY: No anxiety or depression.   ROS  DRUG ALLERGIES:   Allergies  Allergen Reactions  . Ciprofloxacin Rash    VITALS:  Blood pressure 110/63, pulse 74, temperature 98.2 F (36.8 C), temperature source Oral, resp. rate 18, height 5\' 8"  (1.727 m), weight 73.5 kg (162 lb), SpO2 97 %.  PHYSICAL EXAMINATION:  GENERAL:  67 y.o.-year-old patient lying in the bed with no acute distress.  EYES: Pupils equal, round, reactive to light and accommodation. No scleral icterus. Extraocular muscles intact.  HEENT: Head atraumatic, normocephalic. Oropharynx and nasopharynx clear.  NECK:  Supple, no jugular venous distention. No thyroid enlargement, no tenderness.  LUNGS: Normal breath sounds bilaterally, some wheezing, no crepitation. No use of accessory muscles of respiration.  CARDIOVASCULAR: S1, S2 normal. No murmurs, rubs, or gallops.  ABDOMEN: Soft, nontender, nondistended. Bowel sounds  present. No organomegaly or mass.  EXTREMITIES: No pedal edema, cyanosis, or clubbing.  NEUROLOGIC: Cranial nerves II through XII are intact. Muscle strength 4-5/5 in all extremities. Sensation intact. Gait not checked.  PSYCHIATRIC: The patient is alert and oriented x 3.  SKIN: No obvious rash, lesion, or ulcer.   Physical Exam LABORATORY PANEL:   CBC Recent Labs  Lab 08/11/17 0511  WBC 4.1  HGB 11.7*  HCT 34.1*  PLT 133*   ------------------------------------------------------------------------------------------------------------------  Chemistries  Recent Labs  Lab 08/09/17 1202  08/11/17 0511  NA 138   < > 140  K 3.9   < > 3.5  CL 104   < > 112*  CO2 24   < > 23  GLUCOSE 103*   < > 108*  BUN 21*   < > 16  CREATININE 1.44*   < > 1.06  CALCIUM 9.4   < > 7.7*  AST 26  --   --   ALT 17  --   --   ALKPHOS 64  --   --   BILITOT 0.6  --   --    < > = values in this interval not displayed.   ------------------------------------------------------------------------------------------------------------------  Cardiac Enzymes Recent Labs  Lab 08/09/17 1202  TROPONINI <0.03   ------------------------------------------------------------------------------------------------------------------  RADIOLOGY:  No results found.  ASSESSMENT AND PLAN:   Active Problems:   Influenza A  1.  Influenza A positive, fever, weakness and orthostatic hypotension.    On Tamiflu and give IV fluid hydration.   Tylenol PRN for fever. 2.  Acute kidney injury on chronic kidney disease stage II.  Give IV fluid hydration. Improved. 3.  Cough and rhonchi in the lungs  start nebulizer treatments with budesonide and DuoNeb   Improved now. 4. Hypotension     Due to Influenza, IV fluids.   Appears improving, still have some dizziness.  Patient still had complain of generalized weakness, chills and some dizziness so requesting to stay in the hospital for one more night.  All the records are  reviewed and case discussed with Care Management/Social Workerr. Management plans discussed with the patient, family and they are in agreement.  CODE STATUS: Full code.  TOTAL TIME TAKING CARE OF THIS PATIENT: 35 minutes.    POSSIBLE D/C IN 1-2 DAYS, DEPENDING ON CLINICAL CONDITION.   Vaughan Basta M.D on 08/11/2017   Between 7am to 6pm - Pager - [phone]  After 6pm go to [email or web address] - password [email or web address] [place]  Office  [phone]  CC: Primary care physician; Idelle Crouch, MD  Note: This dictation was prepared with Dragon dictation along with smaller phrase technology. Any transcriptional errors that result from this process are unintentional.

## 2017-08-11 NOTE — Progress Notes (Signed)
Notified Dr. Fritzi Mandes of patient BP of 86/57. 250 bolus of NS is ordered at this time. Will continue to monitor.

## 2017-08-11 NOTE — Progress Notes (Signed)
Pharmacist - Prescriber Communication  Tamiflu dose modified from 30 mg po BID to 75 mg po BID due to increased creatinine clearance (now >60 mL/min).  Alisia Vanengen A. [place], [place].D., BCPS Clinical Pharmacist 08/11/2017 09:44

## 2017-08-12 MED ORDER — OSELTAMIVIR PHOSPHATE 75 MG PO CAPS: 75.0000 mg | ORAL_CAPSULE | Freq: Two times a day (BID) | ORAL | 0 refills | Status: AC

## 2017-08-12 MED ORDER — ACETAMINOPHEN 325 MG PO TABS: 650.0000 mg | ORAL_TABLET | Freq: Four times a day (QID) | ORAL | 0 refills | Status: DC | PRN

## 2017-08-12 NOTE — Progress Notes (Signed)
Carl Williams to be D/C'd Home per MD order.  Discussed prescriptions and follow up appointments with the patient. Prescriptions given to patient, medication list explained in detail. Pt verbalized understanding.  Allergies as of 08/12/2017      Reactions   Ciprofloxacin Rash      Medication List    TAKE these medications   acetaminophen 325 MG tablet Commonly known as:  TYLENOL Take 2 tablets (650 mg total) by mouth every 6 (six) hours as needed for mild pain or fever (or Fever >/= 101).   oseltamivir 75 MG capsule Commonly known as:  TAMIFLU Take 1 capsule (75 mg total) by mouth 2 (two) times daily for 2 days.       Vitals:   08/12/17 0539 08/12/17 0858  BP: 119/69   Pulse: 61   Resp: 20   Temp: 98.8 F (37.1 C) 98.5 F (36.9 C)  SpO2: 98%     Skin clean, dry and intact without evidence of skin break down, no evidence of skin tears noted. IV catheter discontinued intact. Site without signs and symptoms of complications. Dressing and pressure applied. Pt denies pain at this time. No complaints noted.  An After Visit Summary was printed and given to the patient. Patient escorted with wife, and D/C home via private auto.  Carl Williams Carl Williams 2
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
## 2019-08-28 ENCOUNTER — Other Ambulatory Visit: Payer: Self-pay | Admitting: Neurology

## 2019-08-28 DIAGNOSIS — G3184 Mild cognitive impairment, so stated: Secondary | ICD-10-CM

## 2019-09-21 ENCOUNTER — Ambulatory Visit (HOSPITAL_COMMUNITY): Admission: RE | Admit: 2019-09-21 | Payer: Medicare Other | Source: Ambulatory Visit

## 2019-09-21 ENCOUNTER — Encounter (HOSPITAL_COMMUNITY): Payer: Self-pay

## 2021-01-16 ENCOUNTER — Other Ambulatory Visit: Payer: Self-pay | Admitting: Internal Medicine

## 2021-01-16 ENCOUNTER — Other Ambulatory Visit (HOSPITAL_COMMUNITY): Payer: Self-pay | Admitting: Internal Medicine

## 2021-01-16 DIAGNOSIS — R413 Other amnesia: Secondary | ICD-10-CM

## 2021-01-21 ENCOUNTER — Ambulatory Visit
Admission: RE | Admit: 2021-01-21 | Discharge: 2021-01-21 | Disposition: A | Discharge status: Home / Self Care | Payer: Medicare PPO | Source: Ambulatory Visit | Attending: Internal Medicine | Admitting: Internal Medicine

## 2021-01-21 ENCOUNTER — Other Ambulatory Visit: Payer: Self-pay

## 2021-01-21 DIAGNOSIS — R413 Other amnesia: Secondary | ICD-10-CM | POA: Diagnosis not present

## 2022-09-21 ENCOUNTER — Other Ambulatory Visit: Payer: Self-pay | Admitting: Internal Medicine

## 2022-09-21 DIAGNOSIS — R200 Anesthesia of skin: Secondary | ICD-10-CM

## 2022-09-27 ENCOUNTER — Ambulatory Visit
Admission: RE | Admit: 2022-09-27 | Discharge: 2022-09-27 | Disposition: A | Discharge status: Home / Self Care | Payer: Medicare PPO | Source: Ambulatory Visit | Attending: Internal Medicine | Admitting: Internal Medicine

## 2022-09-27 DIAGNOSIS — R200 Anesthesia of skin: Secondary | ICD-10-CM | POA: Diagnosis present

## 2022-10-14 NOTE — Progress Notes (Signed)
Referring Physician:  Marguarite Arbour, MD [address] [facility] [place],  [place] [address]  Primary Physician:  Marguarite Arbour, MD  History of Present Illness: 10/16/2022 [name] has a history of GERD, skin CA, PUD, PE, hyperlipidemia, OSA, and mixed dementia.   Seen by PCP for left buttocks/leg numbness. Lumbar MRI was done and he is here for evaluation.   He has 3 month history of intermittent numbness in left leg that varies in location, it is generally in his thigh, but can go past his knee. He has intermittent tingling. No current LBP or leg pain. No right leg symptoms. Symptoms in left leg vary, not sure what makes him better or worse. No weakness.   He was started on neurontin 300mg  at night and stopped due to side effects.   Bowel/Bladder Dysfunction: none  Conservative measures:  Physical therapy: none recent Multimodal medical therapy including regular antiinflammatories: neurontin  Injections: denies epidural steroid injections  Past Surgery: denies  Carl Williams has no symptoms of cervical myelopathy.  The symptoms are causing a significant impact on the patient's life.   Review of Systems:  A 10 point review of systems is negative, except for the pertinent positives and negatives detailed in the HPI.  Past Medical History: Past Medical History:  Diagnosis Date   Acute renal failure    Back pain, thoracic    BPH (benign prostatic hyperplasia)    Diarrhea    Fatigue    GERD (gastroesophageal reflux disease)    HLD (hyperlipidemia)    Hydronephrosis    Kidney stone on left side    Melanoma    Memory difficulties    Onychomycosis    PE (pulmonary embolism)    Peptic ulcer    Plantar fascial fibromatosis    Pneumonia    Renal cyst    Right flank pain     Past Surgical History: Past Surgical History:  Procedure Laterality Date   skin cancer removal     TONSILLECTOMY      Allergies: Allergies as of  10/16/2022 - Review Complete 10/16/2022  Allergen Reaction Noted   Ciprofloxacin Rash 12/20/2014    Medications: Outpatient Encounter Medications as of 10/16/2022  Medication Sig   memantine (NAMENDA) 5 MG tablet Take 1 tablet by mouth 2 (two) times daily.   [DISCONTINUED] acetaminophen (TYLENOL) 325 MG tablet Take 2 tablets (650 mg total) by mouth every 6 (six) hours as needed for mild pain or fever (or Fever >/= 101).   No facility-administered encounter medications on file as of 10/16/2022.    Social History: Social History   Tobacco Use   Smoking status: Never   Smokeless tobacco: Never  Substance Use Topics   Alcohol use: No    Alcohol/week: 0.0 standard drinks of alcohol   Drug use: No    Family Medical History: Family History  Problem Relation Age of Onset   Alzheimer's disease Mother    Esophageal cancer Mother    Breast cancer Mother    Stomach cancer Father    Colon cancer Father    Kidney disease Neg Hx    Prostate cancer Neg Hx     Physical Examination: Vitals:   10/16/22 0951  BP: (!) 140/78  Pulse: 60  SpO2: 97%    General: Patient is well developed, well nourished, calm, collected, and in no apparent distress. Attention to examination is appropriate.  Respiratory: Patient is breathing without any difficulty.   NEUROLOGICAL:  Awake, alert, oriented to person, place, and time.  Speech is clear and fluent. Fund of knowledge is appropriate.   Cranial Nerves: Pupils equal round and reactive to light.  Facial tone is symmetric.    No posterior lumbar tenderness.   No abnormal lesions on exposed skin.   Strength: Side Biceps Triceps Deltoid Interossei Grip Wrist Ext. Wrist Flex.  R 5 5 5 5 5 5 5   L 5 5 5 5 5 5 5    Side Iliopsoas Quads Hamstring PF DF EHL  R 5 5 5 5 5 5   L 5 5 5 5 5 5    Reflexes are 1+ and symmetric at the biceps, triceps, brachioradialis, patella and achilles.   Hoffman's is absent.  Clonus is not present.   Bilateral  upper and lower extremity sensation is intact to light touch, but diminished left lateral thigh.   Gait is normal.     Medical Decision Making  Imaging: MRI of lumbar spine dated 09/27/22:  FINDINGS: Segmentation: Standard segmentation is soon. The inferior-most fully formed intervertebral disc labeled L5-S1.   Alignment:  Grade 1 anterolisthesis of L4 on L5.   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 Degenerative/discogenic endplate signal changes at T12-L1. No evidence of acute fracture, discitis/osteomyelitis or suspicious bone lesion.   Conus medullaris and cauda equina: Conus extends to the L1 level. Conus appears normal.   Paraspinal and other soft tissues: Right renal cyst.   Disc levels:   T12-L1: Mild disc bulging.  No significant stenosis.   L1-L2: Mild disc bulging and endplate spurring. Mild subarticular recess stenosis without significant canal or foraminal stenosis.   L2-L3: Mild disc bulging. Bilateral facet arthropathy. No significant stenosis.   L3-L4: Mild disc bulging. Bilateral facet arthropathy. No significant stenosis.   L4-L5: Grade 1 anterolisthesis. Uncovering the disc. Superimposed mild disc bulging with small central disc protrusion. Ligamentum flavum thickening facet arthropathy. Resulting right greater than left subarticular recess stenosis and moderate bilateral foraminal stenosis. Mild central canal stenosis.   L5-S1: Mild disc bulging. Bilateral facet arthropathy. Patent canal and foramina.   IMPRESSION: 1. Degenerative changes greatest at L4-L5 with grade 1 anterolisthesis, moderate bilateral foraminal stenosis, bilateral subarticular recess stenosis, and mild central canal stenosis. 2. Additional milder multilevel degenerative changes detailed above.     Electronically Signed   By: Feliberto HartsFrederick S Jones M.D.   On: 09/30/2022 09:05  I have personally reviewed the images and agree with the above  interpretation.  Assessment and Plan: Carl Williams is a pleasant 72 y.o. male has 3 month history of intermittent numbness in left leg that varies in location, it is generally in his thigh, but can go past his knee. He has intermittent tingling. No current LBP or leg pain. No right leg symptoms. No weakness.   He has known lumbar spondylosis and DDD. He has slip at L4-L5 with mild central and moderate bilateral foraminal stenosis. Numbness/tingling likely from L4-5.   Treatment options discussed with patient and following plan made:   - Discussed revisiting PT to see if this will help with numbness, he declines.  - Discussed that I do not think lumbar injections would help with numbness.  - Unable to tolerate neurontin due to side effects.  - As he is not having pain, he wants to watch this for now. Can consider EMG if no improvement.  - Will message him in 4 weeks to check on him. He will call if symptoms get worse or he develops pain.  I spent a total of 30 minutes in face-to-face and non-face-to-face activities related to this patient's care today including review of outside records, review of imaging, review of symptoms, physical exam, discussion of differential diagnosis, discussion of treatment options, and documentation.   Thank you for involving me in the care of this patient.   Geronimo Boot PA-C Dept. of Neurosurgery

## 2022-10-16 ENCOUNTER — Ambulatory Visit: Payer: Medicare PPO | Admitting: Orthopedic Surgery

## 2022-10-16 ENCOUNTER — Encounter: Payer: Self-pay | Admitting: Orthopedic Surgery

## 2022-10-16 VITALS — BP 140/78 | HR 60 | Ht 67.0 in | Wt 161.4 lb

## 2022-10-16 DIAGNOSIS — M4316 Spondylolisthesis, lumbar region: Secondary | ICD-10-CM

## 2022-10-16 DIAGNOSIS — R200 Anesthesia of skin: Secondary | ICD-10-CM

## 2022-10-16 DIAGNOSIS — R202 Paresthesia of skin: Secondary | ICD-10-CM | POA: Diagnosis not present

## 2022-10-16 DIAGNOSIS — M47816 Spondylosis without myelopathy or radiculopathy, lumbar region: Secondary | ICD-10-CM

## 2022-10-16 DIAGNOSIS — M48061 Spinal stenosis, lumbar region without neurogenic claudication: Secondary | ICD-10-CM

## 2022-10-16 NOTE — Patient Instructions (Signed)
It was so nice to see you today. Thank you so much for coming in.    Your have wear and tear in your back (arthritis). The worst level is L4-L5 where you have a slip, mild spinal stenosis (pressure on spinal cord), and some irritation of the nerves on the right and left.   PT may help with numbness, let me know if you want to revisit it.   I don't think an injection would help with the numbness.   Let me know if things get worse or you develop pain.   I will message you in 4 weeks to check on you.   It was so nice to talk to you today!  Please do not hesitate to call if you have any questions or concerns. You can also message me in MyChart.   Drake Leach PA-C [phone]
